# Patient Record
Sex: Female | Born: 1975
Health system: Southern US, Community
[De-identification: ages and names within clinical notes are randomized; demographics above are authoritative.]

## PROBLEM LIST (undated history)

## (undated) VITALS — BP 119/83 | HR 71 | Temp 97.3°F | Resp 20 | Ht 66.0 in | Wt 303.0 lb

## (undated) DIAGNOSIS — F419 Anxiety disorder, unspecified: Secondary | ICD-10-CM

## (undated) DIAGNOSIS — J45909 Unspecified asthma, uncomplicated: Secondary | ICD-10-CM

## (undated) DIAGNOSIS — F329 Major depressive disorder, single episode, unspecified: Secondary | ICD-10-CM

## (undated) DIAGNOSIS — F32A Depression, unspecified: Secondary | ICD-10-CM

## (undated) DIAGNOSIS — R7303 Prediabetes: Secondary | ICD-10-CM

## (undated) DIAGNOSIS — E119 Type 2 diabetes mellitus without complications: Secondary | ICD-10-CM

## (undated) DIAGNOSIS — E669 Obesity, unspecified: Secondary | ICD-10-CM

## (undated) DIAGNOSIS — F4310 Post-traumatic stress disorder, unspecified: Secondary | ICD-10-CM

## (undated) HISTORY — DX: Post-traumatic stress disorder, unspecified: F43.10

## (undated) HISTORY — DX: Anxiety disorder, unspecified: F41.9

## (undated) HISTORY — DX: Depression, unspecified: F32.A

## (undated) HISTORY — DX: Major depressive disorder, single episode, unspecified: F32.9

## (undated) HISTORY — DX: Unspecified asthma, uncomplicated: J45.909

## (undated) HISTORY — DX: Obesity, unspecified: E66.9

## (undated) HISTORY — DX: Prediabetes: R73.03

---

## 1998-07-04 ENCOUNTER — Emergency Department (HOSPITAL_COMMUNITY): Admission: EM | Admit: 1998-07-04 | Discharge: 1998-07-04 | Payer: Self-pay | Admitting: Emergency Medicine

## 1998-07-05 ENCOUNTER — Encounter: Payer: Self-pay | Admitting: Emergency Medicine

## 1998-07-05 ENCOUNTER — Ambulatory Visit (HOSPITAL_COMMUNITY): Admission: RE | Admit: 1998-07-05 | Discharge: 1998-07-05 | Payer: Self-pay | Admitting: Emergency Medicine

## 2002-01-16 ENCOUNTER — Encounter: Admission: RE | Admit: 2002-01-16 | Discharge: 2002-04-16 | Payer: Self-pay | Admitting: Internal Medicine

## 2002-03-23 HISTORY — PX: BACK SURGERY: SHX140

## 2002-03-23 HISTORY — PX: SPINAL FUSION: SHX223

## 2002-10-30 ENCOUNTER — Encounter: Payer: Self-pay | Admitting: Internal Medicine

## 2002-10-30 ENCOUNTER — Ambulatory Visit (HOSPITAL_COMMUNITY): Admission: RE | Admit: 2002-10-30 | Discharge: 2002-10-30 | Payer: Self-pay | Admitting: Internal Medicine

## 2003-01-08 ENCOUNTER — Emergency Department (HOSPITAL_COMMUNITY): Admission: EM | Admit: 2003-01-08 | Discharge: 2003-01-09 | Payer: Self-pay | Admitting: Emergency Medicine

## 2003-02-09 ENCOUNTER — Emergency Department (HOSPITAL_COMMUNITY): Admission: EM | Admit: 2003-02-09 | Discharge: 2003-02-09 | Payer: Self-pay | Admitting: Emergency Medicine

## 2003-03-24 ENCOUNTER — Emergency Department (HOSPITAL_COMMUNITY): Admission: EM | Admit: 2003-03-24 | Discharge: 2003-03-24 | Payer: Self-pay | Admitting: Emergency Medicine

## 2003-07-25 ENCOUNTER — Inpatient Hospital Stay (HOSPITAL_COMMUNITY): Admission: RE | Admit: 2003-07-25 | Discharge: 2003-07-28 | Payer: Self-pay | Admitting: Neurosurgery

## 2003-09-13 ENCOUNTER — Encounter: Admission: RE | Admit: 2003-09-13 | Discharge: 2003-09-13 | Payer: Self-pay | Admitting: Neurosurgery

## 2003-10-23 ENCOUNTER — Ambulatory Visit (HOSPITAL_COMMUNITY): Admission: RE | Admit: 2003-10-23 | Discharge: 2003-10-23 | Payer: Self-pay | Admitting: Internal Medicine

## 2003-11-02 ENCOUNTER — Encounter: Admission: RE | Admit: 2003-11-02 | Discharge: 2003-11-02 | Payer: Self-pay | Admitting: Neurosurgery

## 2004-01-31 ENCOUNTER — Emergency Department (HOSPITAL_COMMUNITY): Admission: EM | Admit: 2004-01-31 | Discharge: 2004-01-31 | Payer: Self-pay | Admitting: Emergency Medicine

## 2004-04-29 ENCOUNTER — Encounter: Admission: RE | Admit: 2004-04-29 | Discharge: 2004-04-29 | Payer: Self-pay | Admitting: Neurosurgery

## 2004-05-15 ENCOUNTER — Ambulatory Visit: Payer: Self-pay | Admitting: Psychiatry

## 2004-05-15 ENCOUNTER — Inpatient Hospital Stay (HOSPITAL_COMMUNITY): Admission: RE | Admit: 2004-05-15 | Discharge: 2004-05-19 | Payer: Self-pay | Admitting: Psychiatry

## 2004-12-15 ENCOUNTER — Other Ambulatory Visit (HOSPITAL_COMMUNITY): Admission: RE | Admit: 2004-12-15 | Discharge: 2005-03-15 | Payer: Self-pay | Admitting: Psychiatry

## 2004-12-15 ENCOUNTER — Ambulatory Visit: Payer: Self-pay | Admitting: Psychiatry

## 2006-03-02 ENCOUNTER — Emergency Department (HOSPITAL_COMMUNITY): Admission: EM | Admit: 2006-03-02 | Discharge: 2006-03-02 | Payer: Self-pay | Admitting: Emergency Medicine

## 2006-10-07 ENCOUNTER — Ambulatory Visit: Payer: Self-pay | Admitting: Family Medicine

## 2006-10-07 ENCOUNTER — Ambulatory Visit: Payer: Self-pay | Admitting: *Deleted

## 2006-10-14 ENCOUNTER — Ambulatory Visit: Payer: Self-pay | Admitting: Internal Medicine

## 2007-02-09 ENCOUNTER — Emergency Department (HOSPITAL_COMMUNITY): Admission: EM | Admit: 2007-02-09 | Discharge: 2007-02-09 | Payer: Self-pay | Admitting: Family Medicine

## 2007-02-10 ENCOUNTER — Emergency Department (HOSPITAL_COMMUNITY): Admission: EM | Admit: 2007-02-10 | Discharge: 2007-02-10 | Payer: Self-pay | Admitting: Emergency Medicine

## 2008-03-06 ENCOUNTER — Emergency Department (HOSPITAL_COMMUNITY): Admission: EM | Admit: 2008-03-06 | Discharge: 2008-03-06 | Payer: Self-pay | Admitting: Emergency Medicine

## 2008-03-23 HISTORY — PX: OTHER SURGICAL HISTORY: SHX169

## 2008-06-13 ENCOUNTER — Ambulatory Visit (HOSPITAL_COMMUNITY): Admission: RE | Admit: 2008-06-13 | Discharge: 2008-06-13 | Payer: Self-pay | Admitting: Emergency Medicine

## 2008-09-28 ENCOUNTER — Encounter (INDEPENDENT_AMBULATORY_CARE_PROVIDER_SITE_OTHER): Payer: Self-pay | Admitting: Obstetrics & Gynecology

## 2008-09-28 ENCOUNTER — Ambulatory Visit (HOSPITAL_COMMUNITY): Admission: RE | Admit: 2008-09-28 | Discharge: 2008-09-28 | Payer: Self-pay | Admitting: Obstetrics & Gynecology

## 2008-12-30 ENCOUNTER — Emergency Department (HOSPITAL_COMMUNITY): Admission: EM | Admit: 2008-12-30 | Discharge: 2008-12-30 | Payer: Self-pay | Admitting: Emergency Medicine

## 2009-04-13 ENCOUNTER — Emergency Department (HOSPITAL_COMMUNITY): Admission: EM | Admit: 2009-04-13 | Discharge: 2009-04-13 | Payer: Self-pay | Admitting: Emergency Medicine

## 2009-10-20 ENCOUNTER — Emergency Department (HOSPITAL_COMMUNITY): Admission: EM | Admit: 2009-10-20 | Discharge: 2009-10-20 | Payer: Self-pay | Admitting: Family Medicine

## 2010-02-04 ENCOUNTER — Emergency Department (HOSPITAL_COMMUNITY): Admission: EM | Admit: 2010-02-04 | Discharge: 2010-02-04 | Payer: Self-pay | Admitting: Family Medicine

## 2010-02-19 ENCOUNTER — Emergency Department (HOSPITAL_COMMUNITY)
Admission: EM | Admit: 2010-02-19 | Discharge: 2010-02-19 | Payer: Self-pay | Source: Home / Self Care | Admitting: Family Medicine

## 2010-04-13 ENCOUNTER — Encounter: Payer: Self-pay | Admitting: Emergency Medicine

## 2010-05-05 ENCOUNTER — Other Ambulatory Visit: Payer: Self-pay | Admitting: Internal Medicine

## 2010-05-05 DIAGNOSIS — Z1231 Encounter for screening mammogram for malignant neoplasm of breast: Secondary | ICD-10-CM

## 2010-05-26 ENCOUNTER — Ambulatory Visit
Admission: RE | Admit: 2010-05-26 | Discharge: 2010-05-26 | Disposition: A | Discharge status: Home / Self Care | Payer: 59 | Source: Ambulatory Visit | Attending: Internal Medicine | Admitting: Internal Medicine

## 2010-05-26 ENCOUNTER — Ambulatory Visit: Payer: Self-pay

## 2010-05-26 DIAGNOSIS — Z1231 Encounter for screening mammogram for malignant neoplasm of breast: Secondary | ICD-10-CM

## 2010-06-03 LAB — POCT RAPID STREP A (OFFICE): Streptococcus, Group A Screen (Direct): POSITIVE — AB

## 2010-06-26 LAB — POCT RAPID STREP A (OFFICE): Streptococcus, Group A Screen (Direct): NEGATIVE

## 2010-06-29 LAB — CBC
HCT: 37.3 % (ref 36.0–46.0)
Hemoglobin: 12.6 g/dL (ref 12.0–15.0)
MCV: 84.1 fL (ref 78.0–100.0)
RBC: 4.43 MIL/uL (ref 3.87–5.11)

## 2010-06-29 LAB — GLUCOSE, CAPILLARY: Glucose-Capillary: 142 mg/dL — ABNORMAL HIGH (ref 70–99)

## 2010-06-29 LAB — BASIC METABOLIC PANEL
Calcium: 8.9 mg/dL (ref 8.4–10.5)
Creatinine, Ser: 0.76 mg/dL (ref 0.4–1.2)
GFR calc non Af Amer: >60 mL/min (ref >60)
Potassium: 4 mEq/L (ref 3.5–5.1)

## 2010-07-07 ENCOUNTER — Encounter: Payer: 59 | Attending: Internal Medicine

## 2010-07-07 DIAGNOSIS — Z713 Dietary counseling and surveillance: Secondary | ICD-10-CM | POA: Insufficient documentation

## 2010-07-07 DIAGNOSIS — E119 Type 2 diabetes mellitus without complications: Secondary | ICD-10-CM | POA: Insufficient documentation

## 2010-08-05 ENCOUNTER — Encounter: Payer: 59 | Attending: Internal Medicine

## 2010-08-05 DIAGNOSIS — Z713 Dietary counseling and surveillance: Secondary | ICD-10-CM | POA: Insufficient documentation

## 2010-08-05 DIAGNOSIS — E119 Type 2 diabetes mellitus without complications: Secondary | ICD-10-CM | POA: Insufficient documentation

## 2010-08-05 NOTE — Op Note (Signed)
[name], [name]             ACCOUNT NO.:  [id number]   MEDICAL RECORD NO.:  [id number]          PATIENT TYPE:  AMB   LOCATION:  SDC                           FACILITY:  [facility]   PHYSICIAN:  Genia Del, M.D.DATE OF BIRTH:  [birth year]   DATE OF PROCEDURE:  09/28/2008  DATE OF DISCHARGE:                               OPERATIVE REPORT   PREOPERATIVE DIAGNOSIS:  Heavy menses need for routine Pap test.   POSTOPERATIVE DIAGNOSIS:  Heavy menses need for routine Pap test plus  hymenal band.   PROCEDURE:  Pap test, dilatation and curettage, NovaSure endometrial  ablation, and excision of hymenal band.   SURGEON:  Genia Del, MD   ASSISTANT:  No assistant.   ANESTHESIOLOGIST:  Raul Del, MD   PROCEDURE:  Under general anesthesia with endotracheal intubation, the  patient is in lithotomy position.  She is prepped with Betadine on the  suprapubic and vulvar areas.  The bladder is catheterized and the  patient is draped as usual.  We insert the speculum in the vagina.  At  that time, we note that the patient has a hymenal band.  We are able to  enter the [facility] speculum on the right opening of the hymen.  We  visualized the cervix.  A Pap test is done.  We then complete the  Betadine prep intravaginally.  We grasped the anterior lip of the cervix  with a tenaculum.  We do a paracervical block with Nesacaine 1% total of  20 mL at 4 and 8 o'clock.  We measured the cervical length which is at 5  cm.  The hysterometry is at 10 cm for a cavity length of 5 cm.  We then  dilate the cervix with Hegar dilators up to #29 without difficulty.  We  proceed with a systematic curettage of the intrauterine cavity with a  sharp curette.  The endometrial curettings are sent to pathology.  We  then insert the NovaSure instrument in the intrauterine cavity.  The  width of the uterus is measured at 4.5 cm.  We then did a test for the  integrity of the cavity and it is passed.  We  then proceed with the  endometrial ablation at a power of 124 for 1 minute 25 seconds.  The  instrument is then removed easily.  We removed the tenaculum from the  cervix.  Hemostasis is adequate at that level.  The speculum is removed.  We note mild bleeding at the superior aspect and at the base of the  hymenal band.  Decision is taken to excise the band.  We used the  electrocautery to resect at the base and then at the superior aspect  staying far from the urethra.  We then used Vicryl 2-0 to close the  mucosa at that level and complete hemostasis.  Two stitches are done  superiorly and 1 stitch at the base.  Hemostasis is adequate.  All  instruments are therefore removed.  The estimated blood loss was  minimal.  No complications occurred and the patient was brought to  recovery room in good stable  status.      Genia Del, M.D.  Electronically Signed     ML/MEDQ  D:  09/28/2008  T:  09/29/2008  Job:  [id number]
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
## 2010-08-08 NOTE — Op Note (Signed)
NAMEFRANKYE, [name]                        ACCOUNT NO.:  [id number]   MEDICAL RECORD NO.:  [id number]                   PATIENT TYPE:  INP   LOCATION:  2899                                 FACILITY:  [facility]   PHYSICIAN:  Donalee Citrin, M.D.                     DATE OF BIRTH:  Apr 21, 1975   DATE OF PROCEDURE:  07/25/2003  DATE OF DISCHARGE:                                 OPERATIVE REPORT   PREOPERATIVE DIAGNOSIS:  Discogenic mechanical low back pain and lumbar  spinal stenosis from large central ruptured disk at L4-5.   PROCEDURES:  1. Decompressive lumbar laminectomy, L4-5.  2. Posterior lumbar interbody fusion, L4-5.  3. Posterolateral arthrodesis using locally-harvested autograft, L4-5.  4. Posterior instrumentation using the M10 CD Horizon Legacy pedicle screw     system, L4-5.  5. Placement of a medium Hemovac drain.   SURGEON:  Donalee Citrin, M.D.   ASSISTANT:  Tia Alert, M.D.   ANESTHESIA:  General endotracheal.   HISTORY OF PRESENT ILLNESS:  The patient is a very pleasant 35 year old  female who has had longstanding back and bilateral leg pain consistent with  L5 radiculopathy.  The patient failed all modalities of conservative  treatment with physical therapy, steroid injections, and anti-  inflammatories.  The patient was taken to the operating room, where she  showed a very large central ruptured disk causing lumbar spinal stenosis and  severe degenerative collapse and annular tear.  The patient's symptomatology  was predominantly back much greater than leg pain, with bilateral  radiculopathy.  Due to the size of the fragment, its location, the  degeneration of the disk space, and the patient's symptomatology, the  patient was recommended a decompression and stabilization procedure.  I  extensively went over the risks and benefits of surgery with her.  She  understands and agreed to proceed forward.   The patient was brought into the OR, was induced under general  anesthesia,  placed prone on the Wilson frame.  The back was prepped and draped in the  usual sterile fashion.  After infiltration of 10 mL of lidocaine with  epinephrine, a midline incision made.  Bovie electrocautery was used to take  the incision through the subcutaneous tissue and subperiosteal dissection  carried out on the lamina of L3, L4, and L5, exposing the TPs of L4 and L5  bilaterally.  An intraoperative x-ray confirmed localization of the L4-5  disk space.  There was noted to be a synovial cyst projected posteriorly off  the lateral aspect of the facet complex on the right at L4-5, and this was  bitten down with a Leksell rongeur.  The medial aspect of the facet  complexes at the articulation was drilled down with a high-speed drill.  A  Leksell rongeur was used to remove the spinous process of L4 and then using  a 3 and 4 mm Kerrison punch,  complete radical medial factectomies and  laminectomies were completed at L4-5, exposing the proximal aspect of the L4  and L5 nerve roots bilaterally.  Using a 3 and 4 mm Kerrison punch, the L4  and L5 nerve roots were opened up in their foramen.  Then the large amount  of epidural veins were coagulated over the disk spaces on each side.  Then a  D'Errico nerve root retractor was used to reflect the right L5 nerve root  medially.  Annulotomy was made.  A pituitary rongeur was used to clean out  the disk space on the right side.  Several fragments of disk were removed  from the central compartment at this level, decompressing the central thecal  sac.  Then a 10 distractor was inserted.  Intraoperative fluoroscopy  confirmed good apposition of the end plates at this level.  Then on the left  side a D'Errico nerve root retractor was used to reflect the left L5 nerve  root medially.  The disk space was radically cleaned out with pituitary  rongeurs.  Several fragments of disk were removed, and a downgoing Epstein  curette was used to scrape  down several large disk fragments contained  within the ligament into the central disk space and cleaned out with  pituitary rongeurs.  Then using a size 10 cutter and chisel, the end plates  were prepared to receive the bone graft on the left side, fluoroscopy  confirming depth and trajectory at each step along the way.  Then a 10 x 26  mm Tangent allograft was inserted on the left side.  Then on the right side  the distractor was removed, the disk space was cleaned out, and the cutter  and chisel were used to prepare the end plates on this side.  Locally-  harvested autograft was packed against the left-sided allograft and the  right-sided allograft was inserted.  All allografts had good apposition with  the endplates, confirmed by fluoroscopy.  Then after both allografts had  been placed and placement confirmed, attention was taken to pedicle screw  placement.  Using a high-speed drill and fluoroscopy, pilot holes were  drilled at the L4 pedicle on the left, cannulated with the awl, probed,  tapped with a 5.5 tap, probed, and a 6.5 x 40 pedicle was inserted on the  left at L4.  This pedicle was probed from within the pedicle as well as  within the canal, confirming no medial or lateral breach.  Fluoroscopy  confirmed trajectory, and this screw had excellent purchase.  This procedure  was repeated at L5 on the left and at L4-5 on the right.  After all four  pedicle screws had been placed, aggressive irrigation was achieved, then  decortication along the lateral TPs and lateral gutters at that level, L4  and L5.  Then the remainder of the locally-harvested autograft was packed in  the lateral gutters over the decorticated TP and lateral facet complexes.  Then a 40 mm rod was sized, selected, and inserted, top-tightening nuts  tightened down at L5, the L4 pedicle screw was compressed against L5, and a  422 crosslink was inserted.  After all bone graft and rods and screws and crosslink were  inserted, fluoroscopy confirmed good position of the bone  grafts and screws and rods.  The neural foramina were reexplored with a  hockey stick and coronary dilator and noted to have widely patent as well as  underneath the thecal sac, and the central compartment noted to  be patent as  well.  Then Gelfoam was overlaid on top of the dura.  A medium Hemovac drain  was placed.  The muscle and fascia were reapproximated with 0 interrupted  Vicryl, the subcutaneous tissue was closed with 2-0 interrupted Vicryl, and  the skin was closed with a running 4-0 subcuticular suture.  Benzoin and  Steri-Strips were applied.  The patient went to the recovery room in stable  condition.  At the end of the procedure, all needle, instrument, and sponge  counts were correct.                                               Donalee Citrin, M.D.    GC/MEDQ  D:  07/25/2003  T:  07/25/2003  Job:  [id number]
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
## 2010-10-01 ENCOUNTER — Encounter: Payer: 59 | Attending: Internal Medicine | Admitting: *Deleted

## 2010-10-01 DIAGNOSIS — Z713 Dietary counseling and surveillance: Secondary | ICD-10-CM | POA: Insufficient documentation

## 2010-10-01 DIAGNOSIS — E119 Type 2 diabetes mellitus without complications: Secondary | ICD-10-CM | POA: Insufficient documentation

## 2010-10-02 ENCOUNTER — Encounter: Payer: Self-pay | Admitting: *Deleted

## 2010-10-02 NOTE — Patient Instructions (Addendum)
Goals:  Stay within carb ranges given at class to facilitate weight loss and regulate blood glucose.  Choose more whole grains, lean protein, low-fat dairy, and fruits/non-starchy vegetables.  Aim for 20 min of moderate physical activity 2-3 days a week.  Limit sugar-sweetened beverages and concentrated sweets.  Aim for 25-30g of fiber and 2000-2400mg  or less of sodium daily.

## 2010-10-02 NOTE — Progress Notes (Signed)
  Medical Nutrition Therapy:  Appt start time: 1700 end time:  1800.   Assessment:  Primary concerns today: Obesity; Pre-DM.  Pt previously attended DM classes at [facility] (06/2010) and here for individual counseling. Requests education on "what DM is doing in my body".  States she doesn't understand exactly WHY it's bad to have high BGs.  Not currently monitoring BG d/t frustration of values "swinging back and forth".  FBG ranged from 92-130 mg/dL; 2 hr PP: 409-811 mg/dL. Food recall reveals excessive intake of refine CHO (soda or sweet tea = 20-60oz/day, pop tarts, sugary cereal) as well as starches at meals. Pt also reports high sugar snacks before bed and breakfast skipping.     MEDICATIONS: none reported   DIETARY INTAKE Usual eating pattern includes 2 meals and 3 snacks per day.  24-hr recall:  B (AM): Poptart (frosted strawberry) OR SKIPS  Snk ( AM): Dannon light & fit yogurt (flavored), crystal light  L ( PM): Michelinas fettucine alfredo and banana, crystal light  Snk ( PM): Dannon yogurt D ( PM): Chicken breast in soy sauce, green beans, cucumber & tomatoes, mini corn on cobb Snk ( PM): 20 oz Coke + 1.5 cups Froot Loops w/ milk or 15-18 vanilla wafers  Usual physical activity: no structured exercise noted.  Estimated energy needs: 1400-1600 calories 165-170 g carbohydrates 110-115 g protein 40-42 g fat  Progress Towards Goal(s):  NEW   Nutritional Diagnosis:  [place]-3.3 Morbid obesity related to excessive refined sugar and fat intake as evidenced by patient-reported 24-hour recall and a BMI of 49.3 kg/m2.    Intervention Goals:  Stay within carb ranges given at class to facilitate weight loss and regulate blood glucose.  Choose more whole grains, lean protein, low-fat dairy, and fruits/non-starchy vegetables.  Aim for 20 min of moderate physical activity 2-3 days a week.  Limit sugar-sweetened beverages and concentrated sweets.  Aim for 25-30g of fiber and 2000-2400mg  or  less of sodium daily.  Monitoring/Evaluation:  Dietary intake, exercise, A1c, and body weight in 4 week(s).

## 2010-11-03 ENCOUNTER — Inpatient Hospital Stay (INDEPENDENT_AMBULATORY_CARE_PROVIDER_SITE_OTHER)
Admission: RE | Admit: 2010-11-03 | Discharge: 2010-11-03 | Disposition: A | Discharge status: Home / Self Care | Payer: 59 | Source: Ambulatory Visit | Attending: Family Medicine | Admitting: Family Medicine

## 2010-11-03 DIAGNOSIS — M779 Enthesopathy, unspecified: Secondary | ICD-10-CM

## 2010-11-22 ENCOUNTER — Emergency Department (HOSPITAL_COMMUNITY): Payer: 59

## 2010-11-22 ENCOUNTER — Inpatient Hospital Stay (HOSPITAL_COMMUNITY)
Admission: EM | Admit: 2010-11-22 | Discharge: 2010-11-25 | DRG: 149 | Disposition: A | Discharge status: Home / Self Care | Payer: 59 | Attending: Internal Medicine | Admitting: Internal Medicine

## 2010-11-22 DIAGNOSIS — R1115 Cyclical vomiting syndrome unrelated to migraine: Secondary | ICD-10-CM | POA: Diagnosis present

## 2010-11-22 DIAGNOSIS — H8110 Benign paroxysmal vertigo, unspecified ear: Principal | ICD-10-CM | POA: Diagnosis present

## 2010-11-22 DIAGNOSIS — E860 Dehydration: Secondary | ICD-10-CM | POA: Diagnosis present

## 2010-11-22 DIAGNOSIS — R7309 Other abnormal glucose: Secondary | ICD-10-CM | POA: Diagnosis present

## 2010-11-22 DIAGNOSIS — R51 Headache: Secondary | ICD-10-CM | POA: Diagnosis present

## 2010-11-22 LAB — CBC
MCH: 28.2 pg (ref 26.0–34.0)
MCHC: 33.6 g/dL (ref 30.0–36.0)
MCV: 84.1 fL (ref 78.0–100.0)
Platelets: 339 10*3/uL (ref 150–400)
RBC: 4.64 MIL/uL (ref 3.87–5.11)
RDW: 12.8 % (ref 11.5–15.5)

## 2010-11-22 LAB — GLUCOSE, CAPILLARY: Glucose-Capillary: 143 mg/dL — ABNORMAL HIGH (ref 70–99)

## 2010-11-22 LAB — DIFFERENTIAL
Basophils Relative: 1 % (ref 0–1)
Eosinophils Absolute: 0.2 10*3/uL (ref 0.0–0.7)
Lymphocytes Relative: 26 % (ref 12–46)
Lymphs Abs: 2.6 10*3/uL (ref 0.7–4.0)
Monocytes Absolute: 0.6 10*3/uL (ref 0.1–1.0)

## 2010-11-22 LAB — URINALYSIS, ROUTINE W REFLEX MICROSCOPIC
Bilirubin Urine: NEGATIVE
Glucose, UA: NEGATIVE mg/dL
Specific Gravity, Urine: 1.009 (ref 1.005–1.030)

## 2010-11-22 LAB — POCT I-STAT, CHEM 8
Calcium, Ion: 1.14 mmol/L (ref 1.12–1.32)
Chloride: 103 mEq/L (ref 96–112)
Glucose, Bld: 142 mg/dL — ABNORMAL HIGH (ref 70–99)
HCT: 40 % (ref 36.0–46.0)
Hemoglobin: 13.6 g/dL (ref 12.0–15.0)
Potassium: 4 mEq/L (ref 3.5–5.1)
TCO2: 23 mmol/L (ref 0–100)

## 2010-11-22 LAB — LIPASE, BLOOD: Lipase: 27 U/L (ref 11–59)

## 2010-11-22 MED ORDER — IOHEXOL 300 MG/ML  SOLN
125.0000 mL | Freq: Once | INTRAMUSCULAR | Status: AC | PRN
  Administered 2010-11-22: 125 mL via INTRAVENOUS

## 2010-11-23 LAB — URINE CULTURE: Culture: NO GROWTH

## 2010-11-23 LAB — COMPREHENSIVE METABOLIC PANEL
ALT: 19 U/L (ref 0–35)
AST: 14 U/L (ref 0–37)
Albumin: 3.1 g/dL — ABNORMAL LOW (ref 3.5–5.2)
Alkaline Phosphatase: 67 U/L (ref 39–117)
CO2: 25 mEq/L (ref 19–32)
Chloride: 100 mEq/L (ref 96–112)
GFR calc non Af Amer: >60 mL/min (ref >60)
Glucose, Bld: 182 mg/dL — ABNORMAL HIGH (ref 70–99)
Potassium: 4.1 mEq/L (ref 3.5–5.1)
Total Bilirubin: 0.5 mg/dL (ref 0.3–1.2)

## 2010-11-23 LAB — TSH: TSH: 0.532 u[IU]/mL (ref 0.350–4.500)

## 2010-11-23 LAB — HEMOGLOBIN A1C: Hgb A1c MFr Bld: 7 % — ABNORMAL HIGH (ref <5.7)

## 2010-11-24 ENCOUNTER — Inpatient Hospital Stay (HOSPITAL_COMMUNITY): Payer: 59

## 2010-11-24 ENCOUNTER — Other Ambulatory Visit (HOSPITAL_COMMUNITY): Payer: 59

## 2010-11-24 LAB — CBC
HCT: 37.4 % (ref 36.0–46.0)
Hemoglobin: 11.9 g/dL — ABNORMAL LOW (ref 12.0–15.0)
MCV: 87.4 fL (ref 78.0–100.0)
RDW: 13 % (ref 11.5–15.5)
WBC: 7.8 10*3/uL (ref 4.0–10.5)

## 2010-11-24 LAB — BASIC METABOLIC PANEL
BUN: 7 mg/dL (ref 6–23)
CO2: 25 mEq/L (ref 19–32)
Chloride: 106 mEq/L (ref 96–112)
Creatinine, Ser: 0.77 mg/dL (ref 0.50–1.10)
GFR calc Af Amer: >60 mL/min (ref >60)
Potassium: 3.7 mEq/L (ref 3.5–5.1)

## 2010-11-24 MED ORDER — IOHEXOL 300 MG/ML  SOLN
100.0000 mL | Freq: Once | INTRAMUSCULAR | Status: AC | PRN
  Administered 2010-11-24: 100 mL via INTRAVENOUS

## 2010-11-24 NOTE — H&P (Signed)
NAMESHERILYN, WINDHORST             ACCOUNT NO.:  [id number]  MEDICAL RECORD NO.:  [id number]  LOCATION:  WLED                         FACILITY:  [facility]  PHYSICIAN:  Marinda Elk, M.D.DATE OF BIRTH:  28-Dec-1975  DATE OF ADMISSION:  11/22/2010 DATE OF DISCHARGE:                             HISTORY & PHYSICAL   PRIMARY CARE DOCTOR:  Lovenia Kim, D.O.  CHIEF COMPLAINT:  Flank pain, nausea, vomiting, abdominal pain.  HISTORY:  This is a 35 year old with no significant past medical history that comes in for nausea and vomiting and dizziness upon standing this morning.  She relates that when she woke up this morning she felt like lightheaded and not able to ambulate, she felt like she is going to fall.  She relates some flank pain, sitting up makes it worse and laying flat makes it better.  This pain has no radiation.  It is intermittent. She relates no vaginal discharge.  No diarrhea.  No fever, no chest pain.  No shortness of breath.  The pain has been there on her flank. It is reproducible by palpation.  So, the ED got a CT scan of the abdomen and pelvis that showed mild gaseous extension, but otherwise negative so we were asked to admit and further evaluate.  ALLERGIES:  To BEE STINGS.  PAST MEDICAL HISTORY: 1. She had a L4-L5 spinal stenosis. 2. Uterine ablation. 3. History of syncope.  MEDICATIONS:  Albuterol.  SOCIAL HISTORY:  She denies alcohol, tobacco, or drugs.  FAMILY HISTORY:  The patient does not have any family to contact.  She does not know.  REVIEW OF SYSTEMS:  10-point review of system done, pertinent positive per HPI.  PHYSICAL EXAMINATION:  VITAL SIGNS:  Temperature 98, pulse 64, respirations 18, satting 100%, blood pressure 117/76. GENERAL:  She is awake, alert and oriented x3, coherent, fluent language. HEENT: Dry mucous membrane. NECK: No JVD.  No bruits.  No thyromegaly.  Anicteric.  No pallor. CARDIOVASCULAR:  She has regular rate and  rhythm with positive S1 and S2. LUNGS:  Good air movement, clear to auscultation. ABDOMEN: Mild epigastric tenderness.  No CVA tenderness.  Pain on the left flank, reproducible by palpation.  No suprapubic pain, but abdomen is soft. EXTREMITIES:  Positive pulses.  No clubbing, cyanosis, or edema. NEURO EXAM:  Nonfocal.  LABORATORIES ON ADMISSION:  Shows a white count of 10.1, hemoglobin of 13.1, platelet count of 339, ANC of 6.6.  Urine pregnancy test is negative.  Her UA does not show any signs of infection.  Her sodium is 138, potassium 4.0, chloride 103, blood glucose of 142, BUN of 9, creatinine 1.9, bicarb of 23.  CT scan of the abdomen and pelvis showed mild gastric distention likely related to recent water administration for CT scan.  No other acute abnormalities.  ASSESSMENT/PLAN: 1. Intractable nausea and vomiting with flank pain.  At this time, the     patient cannot be sent home as she has ongoing vomiting.  So, we     will check a lipase.  We will check also a UDS. We will try to     control her nausea and vomiting with Zofran and Phenergan.  We  will     also use narcotics to control pain, we use Reglan p.r.n. to try to     decrease the nausea and vomiting.  We will check blood cultures x2.     We will check a chest x-ray which was not done by the ED physician     to rule out any pneumonia.  If she is able to tolerate POs, she     could probably be discharged in the morning.  At this time, we will     check a lipase to rule out pancreatitis.  She does not have any     vaginal discharge.  She does not have a white count.  We will also     check a CMET  to rule out any kind of hepatic disease or     gallbladder disease as she still has a gallbladder. 2. Orthostatic hypotension.  We will give IV fluids aggressively.  We     will check a BMET in the morning and check orthostatic in the     morning.     Marinda Elk, M.D.     AF/MEDQ  D:  11/22/2010  T:   11/22/2010  Job:  [id number]  Electronically Signed by Marinda Elk M.D. on 11/24/2010 06:57:10 AM

## 2010-11-29 LAB — CULTURE, BLOOD (ROUTINE X 2): Culture  Setup Time: 201209021058

## 2010-12-02 NOTE — Discharge Summary (Signed)
[name], [name]             ACCOUNT NO.:  [id number]  MEDICAL RECORD NO.:  [id number]  LOCATION:  1519                         FACILITY:  [facility]  PHYSICIAN:  Erick Blinks, MD     DATE OF BIRTH:  July 12, 1975  DATE OF ADMISSION:  11/22/2010 DATE OF DISCHARGE:                              DISCHARGE SUMMARY   PRIMARY CARE PHYSICIAN:  Lovenia Kim, D.O.  DISCHARGE DIAGNOSES: 1. Benign positional vertigo, resolved. 2. Prediabetes. 3. Morbid obesity. 4. Intractable vomiting, resolved. 5. Dehydration, improved. 6. History of neurocardiogenic syncope.  DISCHARGE MEDICATIONS: 1. Meclizine 25 mg p.o. t.i.d. p.r.n. 2. Scopolamine 1.5 mg per 72-hour patch, one patch transdermal q.72     hours p.r.n. 3. Albuterol inhaler 2 puffs inhaled q.6 hours p.r.n.  ADMISSION HISTORY:  This is a 35 year old female who comes in to the hospital with intractable nausea, vomiting, and dizziness.  The patient's symptoms started on the morning of admission.  She felt lightheaded, was unable to ambulate and felt like she was going to fall. She was given some oral medication in the emergency room, but due to persistence of her symptoms, she was felt not safe to discharge home and she was subsequently admitted to the hospital for further evaluation and treatment.  HOSPITAL COURSE: 1. Vertigo.  The patient had a CT of her head which did not show any     acute findings.  We were unable to do an MRI due to the patient's     body habitus.  She was placed on meclizine as well as scopolamine     patch, which promptly resolved her symptoms.  She was also seen by     Physical Therapy for vestibular rehab, and it was noted that she     did have a moderate Dix-Hallpike on the right.  She was educated on     different exercises and stretches to help deal with this.  She     declined any further outpatient vestibular therapy.  The patient     had a TSH done which was also found to be at normal limits.   Cortisol was also normal.  She is ambulating without difficulty, is     requesting to discharge home which appears to be appropriate. 2. Pre-diabetes.  The patient had A1c checked which was found to be 7.     She reports that her primary care physician will be starting her on     metformin.  She was also educated on lifestyle modifications.  DIAGNOSTIC IMAGING: 1. CT abdomen and pelvis done on admission for vomiting, shows mild     gastric retention likely related to recent water administration.     CT scan correlate clinically. 2. Chest x-ray on admission shows no evidence of active pulmonary     disease. 3. CT head on September 3 shows no acute intracranial findings or mass     lesions.  DISCHARGE INSTRUCTIONS:  The patient should continue on heart-healthy diet, conduct her activities tolerated.  She will follow up with her primary care physician as scheduled.  Plan was discussed with the patient who was also in agreement.  CONDITION AT TIME OF DISCHARGE:  Improved.  Erick Blinks, MD     JM/MEDQ  D:  11/25/2010  T:  11/25/2010  Job:  [id number]  cc:   Lovenia Kim, D.O. Fax: [phone]  Electronically Signed by Erick Blinks  on 12/02/2010 10:06:11 PM

## 2010-12-26 LAB — URINE CULTURE: Colony Count: >=100000

## 2010-12-26 LAB — URINE MICROSCOPIC-ADD ON

## 2010-12-26 LAB — PREGNANCY, URINE: Preg Test, Ur: NEGATIVE

## 2010-12-26 LAB — URINALYSIS, ROUTINE W REFLEX MICROSCOPIC
Bilirubin Urine: NEGATIVE
Glucose, UA: NEGATIVE mg/dL
Specific Gravity, Urine: 1.017 (ref 1.005–1.030)
pH: 7 (ref 5.0–8.0)

## 2010-12-30 LAB — POCT RAPID STREP A: Streptococcus, Group A Screen (Direct): NEGATIVE

## 2011-04-24 ENCOUNTER — Other Ambulatory Visit: Payer: Self-pay | Admitting: Emergency Medicine

## 2011-04-24 DIAGNOSIS — Z1231 Encounter for screening mammogram for malignant neoplasm of breast: Secondary | ICD-10-CM

## 2011-06-01 ENCOUNTER — Ambulatory Visit
Admission: RE | Admit: 2011-06-01 | Discharge: 2011-06-01 | Disposition: A | Discharge status: Home / Self Care | Payer: 59 | Source: Ambulatory Visit | Attending: Emergency Medicine | Admitting: Emergency Medicine

## 2011-06-01 DIAGNOSIS — Z1231 Encounter for screening mammogram for malignant neoplasm of breast: Secondary | ICD-10-CM

## 2011-06-03 ENCOUNTER — Other Ambulatory Visit: Payer: Self-pay | Admitting: Emergency Medicine

## 2011-06-03 DIAGNOSIS — Z1231 Encounter for screening mammogram for malignant neoplasm of breast: Secondary | ICD-10-CM

## 2011-09-02 ENCOUNTER — Emergency Department (HOSPITAL_COMMUNITY)
Admission: EM | Admit: 2011-09-02 | Discharge: 2011-09-02 | Disposition: A | Discharge status: Home / Self Care | Payer: 59 | Source: Home / Self Care

## 2011-09-02 ENCOUNTER — Encounter (HOSPITAL_COMMUNITY): Payer: Self-pay | Admitting: *Deleted

## 2011-09-02 DIAGNOSIS — R197 Diarrhea, unspecified: Secondary | ICD-10-CM

## 2011-09-02 DIAGNOSIS — R55 Syncope and collapse: Secondary | ICD-10-CM

## 2011-09-02 NOTE — ED Provider Notes (Signed)
[name] is a 36 y.o. female who presents to Urgent Care today for abdominal cramping associated with diarrhea and potential syncope. 1 AM this morning the patient awoke with abdominal cramping and mild pain.  She went to the bathroom and had nonbloody diarrhea. Will set of the toilet she felt funny. Her partner witnessed a syncopal event that lasted about 30 seconds. She never fell to the floor and quickly regained consciousness. Since this episode she is felt well. She has not had any further syncopal events. She denies any chest pains or palpitations or trouble breathing around the event or sense. She hasn't had any more diarrhea.she denies any nausea vomiting or current abdominal pain dysuria or vaginal discharge.  She has a history of neurocardiogenic syncope with no events recently. She says a syncopal that was not similar.  No seizure activity noted.     PMH reviewed. Significant for diabetes in the past history of neurocardiogenic syncope with no events the last 10 years or so.  History  Substance Use Topics  . Smoking status: Never Smoker   . Smokeless tobacco: Never Used  . Alcohol Use: No   ROS as above Medications reviewed. No current facility-administered medications for this encounter.   No current outpatient prescriptions on file.    Exam:  BP 124/94  Pulse 80  Temp 98.7 F (37.1 C) (Oral)  Resp 20  SpO2 100% Gen: Well NAD, obese  HEENT: EOMI,  MMM, normal neck motion  Lungs: CTABL Nl WOB Heart: RRR no MRG Abd: NABS, NT, ND, soft  Exts: Non edematous BL  LE, warm and well perfused.  Neuro: Alert and oriented x3 normal motion and coordination and gait  No results found for this or any previous visit (from the past 24 hour(s)). No results found.  Assessment and Plan: 36 y.o. female with syncope. Likely syncope was vasovagal. Patient was having a bowel movement and in pain.  She has history of neurocardiogenic syncope but this is not consistent with that.     I'm not sure the cause of her diarrhea. Intermittent nonbloody diarrhea could be food poisoning or many other causes.   Her syncopal that does not appear to be dangerous. She feels well currently and her vital signs are within normal limits as is her exam.  I feel that we can safely discharge the patient tonight with followup with primary care doctor. She may need an event monitor or Holter monitor if this recurs.  Discussed warning signs or symptoms. Please see discharge instructions. Patient expresses understanding.      Rodolph Bong, MD 09/02/11 [phone]

## 2011-09-02 NOTE — ED Notes (Signed)
pT  REPORTS  SYMPTOMS  OF ABD  CRAMPING  ASSOCIATED  WITH  SOME  DIARRHEA  THIS  AM  HAD  A  SYNCOPAL   EPISODE  THIS  AM             BRIEFLY      -  AT  THIS  TIME  SHE  IS  AWAKE  AS  WELL  AS  ALERT AND  ORIENTED      NO  VOMITING

## 2011-09-02 NOTE — Discharge Instructions (Signed)
Thank you for coming in today. Please follow up with your Dr. in about one week.  Call or go to the emergency room if you get worse, have trouble breathing, have chest pains, or palpitations.  I think you he passed out because your hurting with your cramping and diarrhea. I think you will do well.

## 2011-09-03 NOTE — ED Provider Notes (Signed)
Medical screening examination/treatment/procedure(s) were performed by PGY-3 FM resident and as supervising physician I was immediately available for consultation/collaboration.   Rocio Roam Moreno-Coll, MD   Britnee Mcdevitt Moreno-Coll, MD 09/03/11 0935 

## 2011-09-03 NOTE — ED Notes (Signed)
Triage  Ac[facility]  Is  3

## 2012-01-12 ENCOUNTER — Other Ambulatory Visit: Payer: Self-pay | Admitting: Physician Assistant

## 2012-06-19 ENCOUNTER — Encounter (HOSPITAL_COMMUNITY): Payer: Self-pay | Admitting: *Deleted

## 2012-06-19 ENCOUNTER — Inpatient Hospital Stay (HOSPITAL_COMMUNITY)
Admission: AD | Admit: 2012-06-19 | Discharge: 2012-06-21 | DRG: 430 | Disposition: A | Discharge status: Home / Self Care | Payer: BC Managed Care – PPO | Attending: Psychiatry | Admitting: Psychiatry

## 2012-06-19 DIAGNOSIS — F332 Major depressive disorder, recurrent severe without psychotic features: Principal | ICD-10-CM | POA: Diagnosis present

## 2012-06-19 DIAGNOSIS — E669 Obesity, unspecified: Secondary | ICD-10-CM | POA: Diagnosis present

## 2012-06-19 DIAGNOSIS — F331 Major depressive disorder, recurrent, moderate: Secondary | ICD-10-CM | POA: Diagnosis present

## 2012-06-19 DIAGNOSIS — R7309 Other abnormal glucose: Secondary | ICD-10-CM | POA: Diagnosis present

## 2012-06-19 DIAGNOSIS — R45851 Suicidal ideations: Secondary | ICD-10-CM

## 2012-06-19 DIAGNOSIS — Z79899 Other long term (current) drug therapy: Secondary | ICD-10-CM

## 2012-06-19 MED ORDER — ACETAMINOPHEN 325 MG PO TABS: 650.0000 mg | ORAL_TABLET | Freq: Four times a day (QID) | ORAL | Status: DC | PRN

## 2012-06-19 MED ORDER — ALUM & MAG HYDROXIDE-SIMETH 200-200-20 MG/5ML PO SUSP: 30.0000 mL | ORAL | Status: DC | PRN

## 2012-06-19 MED ORDER — MAGNESIUM HYDROXIDE 400 MG/5ML PO SUSP: 30.0000 mL | Freq: Every day | ORAL | Status: DC | PRN
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
## 2012-06-20 ENCOUNTER — Encounter (HOSPITAL_COMMUNITY): Payer: Self-pay | Admitting: Psychiatry

## 2012-06-20 DIAGNOSIS — F332 Major depressive disorder, recurrent severe without psychotic features: Principal | ICD-10-CM

## 2012-06-20 DIAGNOSIS — F331 Major depressive disorder, recurrent, moderate: Secondary | ICD-10-CM | POA: Diagnosis present

## 2012-06-20 LAB — URINALYSIS, ROUTINE W REFLEX MICROSCOPIC
Glucose, UA: 100 mg/dL — AB
Hgb urine dipstick: NEGATIVE
Specific Gravity, Urine: 1.032 — ABNORMAL HIGH (ref 1.005–1.030)
Urobilinogen, UA: 2 mg/dL — ABNORMAL HIGH (ref 0.0–1.0)

## 2012-06-20 LAB — COMPREHENSIVE METABOLIC PANEL
AST: 14 U/L (ref 0–37)
Albumin: 3.4 g/dL — ABNORMAL LOW (ref 3.5–5.2)
Alkaline Phosphatase: 73 U/L (ref 39–117)
Chloride: 99 mEq/L (ref 96–112)
Potassium: 4.3 mEq/L (ref 3.5–5.1)
Sodium: 138 mEq/L (ref 135–145)
Total Bilirubin: 0.3 mg/dL (ref 0.3–1.2)

## 2012-06-20 LAB — CBC
Platelets: 377 10*3/uL (ref 150–400)
RDW: 12.9 % (ref 11.5–15.5)
WBC: 11.1 10*3/uL — ABNORMAL HIGH (ref 4.0–10.5)

## 2012-06-20 LAB — ETHANOL: Alcohol, Ethyl (B): <11 mg/dL (ref 0–11)

## 2012-06-20 LAB — GLUCOSE, CAPILLARY
Glucose-Capillary: 154 mg/dL — ABNORMAL HIGH (ref 70–99)
Glucose-Capillary: 178 mg/dL — ABNORMAL HIGH (ref 70–99)

## 2012-06-20 LAB — RAPID URINE DRUG SCREEN, HOSP PERFORMED
Cocaine: NOT DETECTED
Opiates: NOT DETECTED
Tetrahydrocannabinol: NOT DETECTED

## 2012-06-20 LAB — HEPATIC FUNCTION PANEL
Bilirubin, Direct: 0.1 mg/dL (ref 0.0–0.3)
Indirect Bilirubin: 0.6 mg/dL (ref 0.3–0.9)

## 2012-06-20 LAB — HEMOGLOBIN A1C: Mean Plasma Glucose: 174 mg/dL — ABNORMAL HIGH (ref <117)

## 2012-06-20 MED ORDER — BUPROPION HCL ER (XL) 150 MG PO TB24
150.0000 mg | ORAL_TABLET | Freq: Every day | ORAL | Status: DC
  Administered 2012-06-21: 150 mg via ORAL
  Filled 2012-06-20 (×3): qty 1

## 2012-06-20 MED ORDER — METFORMIN HCL ER 500 MG PO TB24
500.0000 mg | ORAL_TABLET | Freq: Every day | ORAL | Status: DC
  Filled 2012-06-20 (×3): qty 1

## 2012-06-20 MED ORDER — METFORMIN HCL ER 500 MG PO TB24: 1000.0000 mg | ORAL_TABLET | Freq: Every day | ORAL | Status: DC

## 2012-06-20 MED ORDER — CITALOPRAM HYDROBROMIDE 40 MG PO TABS
40.0000 mg | ORAL_TABLET | Freq: Every day | ORAL | Status: DC
  Administered 2012-06-20: 40 mg via ORAL
  Filled 2012-06-20 (×4): qty 1

## 2012-06-20 MED ORDER — ARIPIPRAZOLE 2 MG PO TABS
2.0000 mg | ORAL_TABLET | Freq: Every day | ORAL | Status: DC
  Administered 2012-06-20: 2 mg via ORAL
  Filled 2012-06-20 (×5): qty 1

## 2012-06-20 MED ORDER — INSULIN ASPART 100 UNIT/ML ~~LOC~~ SOLN: 0.0000 [IU] | Freq: Three times a day (TID) | SUBCUTANEOUS | Status: DC

## 2012-06-20 MED ORDER — VITAMIN D (ERGOCALCIFEROL) 1.25 MG (50000 UNIT) PO CAPS
50000.0000 [IU] | ORAL_CAPSULE | ORAL | Status: DC
  Filled 2012-06-20: qty 1
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
## 2012-06-20 NOTE — Progress Notes (Signed)
[facility] LCSW Group Therapy        Overcoming Obstacles 1:15 2:30 PM         06/20/2012 3:29 PM  Type of Therapy:  Group Therapy  Participation Level:  Minimal  Participation Quality:  Appropriate  Affect:  Appropriate and Depressed  Cognitive:  Appropriate  Insight:  Engaged  Engagement in Therapy:  Engaged  Modes of Intervention:  Discussion, Exploration, Problem-solving, Rapport Building and Support  Summary of Progress/Problems:  Patient shared the obstacle she needs to overcome is financial problems.  She shared she and partner have cut expenses and that she has been considering a part time job.  Wynn Banker 06/20/2012, 3:29 PM

## 2012-06-20 NOTE — Progress Notes (Signed)
D: Patient in the dayroom reading on approach.  Patient states she does not want to take Metformin or Insulin because she does not take it at home.  Patient states she does not even check her blood sugar at home.  Patient states she only gets her blood sugar taken at her doctors office.  Patient states she is ready to go home.  Patient states she has learned that it is ok to ask for help.  Patient denies SI/HI and denies AVH.   A: Staff to monitor Q 15 mins for safety.  Encouragement and support offered.  Scheduled medications administered per orders. R: Patient remains safe on the unit.  Patient visible on the unit and interacting with peers.  Patient calm. Cooperative and taking administered medications.

## 2012-06-20 NOTE — Progress Notes (Signed)
Adult Psychoeducational Group Note  Date:  06/20/2012 Time:  7:11 PM  Group Topic/Focus:  Self Care:   The focus of this group is to help patients understand the importance of self-care in order to improve or restore emotional, physical, spiritual, interpersonal, and financial health.  Participation Level:  Minimal  Participation Quality:  Appropriate  Affect:  Appropriate and Flat  Cognitive:  Appropriate  Insight: Appropriate  Engagement in Group:  Limited  Modes of Intervention:  Education  Additional Comments:  Pt actively participated in group by completing the self-care inventory. Pt was called out of group for PA consult. Pt remained out of group for the remainder of the session.  Reinaldo Raddle K 06/20/2012, 7:11 PM

## 2012-06-20 NOTE — Progress Notes (Signed)
Chaplain provided emotional and spiritual support with pt during recreation time.    Pt shared financial struggles, history with conservative family and religious background, stresses with daughter.  Chaplain provided support and empathic presence.    Will continue to follow during admission.

## 2012-06-20 NOTE — Tx Team (Signed)
Interdisciplinary Treatment Plan Update   Date Reviewed:  06/20/2012  Time Reviewed:  10:57 AM  Progress in Treatment:   Attending groups: Yes Participating in groups: Yes Taking medication as prescribed: Yes  Tolerating medication: Yes Family/Significant other contact made: No, but will ask for consent to contact family Patient understands diagnosis: Yes  Discussing patient identified problems/goals with staff: Yes Medical problems stabilized or resolved: Yes Denies suicidal/homicidal ideation: Yes Patient has not harmed self or others: Yes  For review of initial/current patient goals, please see plan of care.  Estimated Length of Stay:  2-3 days  Reasons for Continued Hospitalization:  Anxiety Depression Medication stabilization  New Problems/Goals identified:    Discharge Plan or Barriers:   Home with outpatient follow up  Additional Comments:  Patient advised of admitting to hospital due to SI but no longer endorsing SI/HI.  She rates depression at five and anxiety at zero.  Attendees:  Patient:  06/20/2012 10:57 AM   Signature:  06/20/2012 10:57 AM  Signature 06/20/2012 10:57 AM  Signature: Liborio Nixon, RN 06/20/2012 10:57 AM  Signature:Beverly Terrilee Croak, RN 06/20/2012 10:57 AM  Signature:  Neill Loft RN 06/20/2012 10:57 AM  Signature:  Juline Patch, LCSW 06/20/2012 10:57 AM  Signature: Silverio Decamp, PMH-NP 06/20/2012 10:57 AM  Signature: Liliane Bade, BSW 06/20/2012 10:57 AM  Signature:  06/20/2012 10:57 AM  Signature:    Signature:    Signature:      Scribe for Treatment Team:   Juline Patch,  06/20/2012 10:57 AM

## 2012-06-20 NOTE — Progress Notes (Addendum)
D:  Patient's self inventory sheet, patient has fair sleep, improving appetite, normal energy level, improving attention span.  Rated depression #4, hopelessness #1.  Denied withdrawals.  Denied SI.  Denied physical problems.  After discharge, plans to see psychiatrist and take her medications.  "I want to go home as soon as possible."  No problems taking meds after discharge. A:  Medications administered per MD orders.  Support and encouragement given per MD orders. R:  Following treatment plan.  Denied SI and HI.   Denied A/V hallucinations.  Contracts for safety.  1700  Patient's CBG was 154.  Patient refused novolog 4 units that was to be given per MD orders.  Patient stated she was going to refuse all diabetic medications, that she did not need these medications.  MD informed. Patient stated she wants welbutrin Tuesday morning. UA put in lab refrigerator for pick up Monday night.
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
## 2012-06-21 DIAGNOSIS — F331 Major depressive disorder, recurrent, moderate: Secondary | ICD-10-CM

## 2012-06-21 LAB — GLUCOSE, CAPILLARY: Glucose-Capillary: 130 mg/dL — ABNORMAL HIGH (ref 70–99)

## 2012-06-21 LAB — LIPID PANEL: Cholesterol: 164 mg/dL (ref 0–200)

## 2012-06-21 MED ORDER — BUPROPION HCL ER (XL) 150 MG PO TB24: 150.0000 mg | ORAL_TABLET | Freq: Every day | ORAL | Status: DC

## 2012-06-21 MED ORDER — CITALOPRAM HYDROBROMIDE 40 MG PO TABS: 40.0000 mg | ORAL_TABLET | Freq: Every day | ORAL | Status: DC

## 2012-06-21 MED ORDER — ARIPIPRAZOLE 2 MG PO TABS: 2.0000 mg | ORAL_TABLET | Freq: Every day | ORAL | Status: DC

## 2012-06-21 MED ORDER — METFORMIN HCL ER 500 MG PO TB24: 500.0000 mg | ORAL_TABLET | Freq: Every day | ORAL | Status: DC

## 2012-06-21 NOTE — Progress Notes (Addendum)
[facility] Adult Case Management Discharge Plan :  Will you be returning to the same living situation after discharge: Yes,  Patient is returning to her home. At discharge, do you have transportation home?:Yes,  Patient's wife to transport her home. Do you have the ability to pay for your medications:Yes,  Patient is able to afford medications.  Release of information consent forms completed and in the chart;  Patient's signature needed at discharge.  Patient to Follow up at: Follow-up Information   Follow up with Dr. Jannifer Franklin On 07/07/2012. (Thursday, July 07, 2012 at 1:00 PM.  Please arrvive 15 minutes early for appointment.)    Contact information:   [address] [place], [place]   [address]  [phone]      Follow up with Jorje Guild Alcott.   Contact information:      [phone]      Follow up with Jorje Guild Aloctt On 06/23/2012. (Thursday, June 23, 2012 at 5:00 PM)    Contact information:   [address] [place], [place]   [address]  [phone]      Patient denies SI/HI:   Yes,  Patient was not endorsing SI when seen by writer on 06/20/12 and per MD note, she is not endorsing SI today.  Patient was not endorsing HI or thoughts of self harm when seen by writer on 06/20/12.  Safety Planning and Suicide Prevention discussed:  Yes,  Reviewed during aftercare group.  Wynn Banker 06/21/2012, 5:49 PM

## 2012-06-21 NOTE — [facility] Suicide Risk Assessment (Signed)
Suicide Risk Assessment  Discharge Assessment     Demographic Factors:  Caucasian and Gay, lesbian, or bisexual orientation  Mental Status Per Nursing Assessment::   On Admission:  NA  Current Mental Status by Physician: In full contact with reality. There are no suicidal ideas, plans or intent. States that she feels better. Mood is euthymic, affect is appropriate. Endorses that she had to come when she did so, but that now she has regained a sense of control so she does not need this level of care. She is going to ask her therapist to slow the trauma work and allow her to recover before resuming the work   Loss Factors: NA  Historical Factors: NA  Risk Reduction Factors:   Responsible for children under 74 years of age, Sense of responsibility to family, Living with another person, especially a relative, Positive social support and Positive therapeutic relationship  Continued Clinical Symptoms:  Depression:   Insomnia  Cognitive Features That Contribute To Risk: None identified   Suicide Risk:  Minimal: No identifiable suicidal ideation.  Patients presenting with no risk factors but with morbid ruminations; may be classified as minimal risk based on the severity of the depressive symptoms  Discharge Diagnoses:   AXIS I:  Major Depression recurrent AXIS II:  Deferred AXIS III:   Past Medical History  Diagnosis Date  . Obesity   . Pre-diabetes    AXIS IV:  other psychosocial or environmental problems AXIS V:  61-70 mild symptoms  Plan Of Care/Follow-up recommendations:  Activity:  as tolerated Diet:  regular Pursue outpatient follow up Is patient on multiple antipsychotic therapies at discharge:  No   Has Patient had three or more failed trials of antipsychotic monotherapy by history:  No  Recommended Plan for Multiple Antipsychotic Therapies: N/A   [name] A 06/21/2012, 1:46 PM

## 2012-06-21 NOTE — Progress Notes (Signed)
[facility] INPATIENT:  Family/Significant Other Suicide Prevention Education  Suicide Prevention Education:  Education Completed; [name], Wife, [phone], has been identified by the patient as the family member/significant other with whom the patient will be residing, and identified as the person(s) who will aid the patient in the event of a mental health crisis (suicidal ideations/suicide attempt).  With written consent from the patient, the family member/significant other has been provided the following suicide prevention education, prior to the and/or following the discharge of the patient.  The suicide prevention education provided includes the following:  Suicide risk factors  Suicide prevention and interventions  National Suicide Hotline telephone number  [facility] assessment telephone number  [facility] Emergency Assistance 911  [facility] and/or Residential Mobile Crisis Unit telephone number  Request made of family/significant other to:  Remove weapons (e.g., guns, rifles, knives), all items previously/currently identified as safety concern.  Wife advised there are no guns in the home.  Remove drugs/medications (over-the-counter, prescriptions, illicit drugs), all items previously/currently identified as a safety concern.  Patient wife is a former Veterinary surgeon at [employer] and stated she is aware of Suicide Prevention Education as that was a part of her position at [employer] and she has the phone number for mobile crisis management.    The family member/significant other verbalizes understanding of the suicide prevention education information provided.  The family member/significant other agrees to remove the items of safety concern listed above.  Patient's wife is   [name] 06/21/2012, 9:15 AM

## 2012-06-21 NOTE — Discharge Summary (Signed)
Physician Discharge Summary Note  Patient:  [name] is an 37 y.o., female MRN:  [id number] DOB:  [birth year] Patient phone:  [phone] (home)  Patient address:   [address] [place] [address],   Date of Admission:  06/19/2012 Date of Discharge: 06/21/12  Reason for Admission:  Depression with SI  Discharge Diagnoses: Principal Problem:   Major depressive disorder, recurrent episode, moderate  Review of Systems  Constitutional: Negative.   HENT: Negative.   Eyes: Negative.   Respiratory: Negative.   Cardiovascular: Negative.   Gastrointestinal: Negative.   Genitourinary: Negative.   Musculoskeletal: Negative.   Skin: Negative.   Neurological: Negative.   Endo/Heme/Allergies: Negative.   Psychiatric/Behavioral: Positive for depression. Negative for suicidal ideas, hallucinations, memory loss and substance abuse. The patient has insomnia. The patient is not nervous/anxious.    Axis Diagnosis:   AXIS I:  Major Depression, Recurrent severe AXIS II:  Deferred AXIS III:   Past Medical History  Diagnosis Date  . Obesity   . Pre-diabetes    AXIS IV:  other psychosocial or environmental problems and problems with primary support group AXIS V:  61-70 mild symptoms  Level of Care:  OP  Hospital Course:  This was a voluntary admission for this 37 year old MWF who presented to [facility] as a walk in with her therapist, reporting increasing depression, suicidal ideation with a plan to jump off the down town parking deck, poor sleep, carbohydrate craving, crying spells, inability to focus at work, poor concentration, increased frustration over small things, mood swings, and decreased self care.  The duration of stay was two days.The patient was seen and evaluated by the Treatment team consisting of Psychiatrist, NP-C, RN, Case Manager, and Therapist for evaluation and treatment plan with goal of stabilization upon discharge. The patient's physical and mental health problems  were identified and treated appropriately.      Multiple modalities of treatment were used including medication, individual and group therapies, unit programming, improved nutrition, physical activity, and family sessions as needed. The MD managed the medication during the admission starting Abilify 2 mg daily, Wellbutrin XL 150 mg daily, continued Celexa 40 mg. Metformin 24hr 500 mg was ordered to address the patient's diabetes. However, the patient mainly refused the medication reporting that she did not take anything at home. Her A1C was diagnostic of diabetes at 7.7. [name] was not receptive to education regarding this diagnosis.      The symptoms of Depression were monitored daily by evaluation by clinical provider.  The patient's mental and emotional status was evaluated by a daily self inventory completed by the patient.      Improvement was demonstrated by declining numbers on the self assessment, improving vital signs, increased cognition, and improvement in mood, sleep, appetite as well as a reduction in physical symptoms.       The patient was evaluated and found to be stable enough for discharge and was released to home per the initial plan of treatment.   Mental Status Exam:  For mental status exam please see mental status exam and  suicide risk assessment completed by attending physician prior to discharge.    Consults:  None  Significant Diagnostic Studies:  labs: Chem profile, Lipid profile, CBC  Discharge Vitals:   Blood pressure 119/83, pulse 71, temperature 97.3 F (36.3 C), temperature source Oral, resp. rate 20, height 5\' 6"  (1.676 m), weight 137.44 kg (303 lb). Body mass index is 48.93 kg/(m^2). Lab Results:   Results for  orders placed during the hospital encounter of 06/19/12 (from the past 72 hour(s))  HEPATIC FUNCTION PANEL     Status: Abnormal   Collection Time    06/20/12  6:25 AM      Result Value Range   Total Protein 7.1  6.0 - 8.3 g/dL   Albumin 3.3 (*) 3.5 -  5.2 g/dL   AST 14  0 - 37 U/L   ALT 15  0 - 35 U/L   Alkaline Phosphatase 76  39 - 117 U/L   Total Bilirubin 0.7  0.3 - 1.2 mg/dL   Bilirubin, Direct 0.1  0.0 - 0.3 mg/dL   Indirect Bilirubin 0.6  0.3 - 0.9 mg/dL  HEMOGLOBIN Z6X     Status: Abnormal   Collection Time    06/20/12  6:25 AM      Result Value Range   Hemoglobin A1C 7.7 (*) <5.7 %   Comment: (NOTE)                                                                               According to the ADA Clinical Practice Recommendations for 2011, when     HbA1c is used as a screening test:      >=6.5%   Diagnostic of Diabetes Mellitus               (if abnormal result is confirmed)     5.7-6.4%   Increased risk of developing Diabetes Mellitus     References:Diagnosis and Classification of Diabetes Mellitus,Diabetes     Care,2011,34(Suppl 1):S62-S69 and Standards of Medical Care in             Diabetes - 2011,Diabetes Care,2011,34 (Suppl 1):S11-S61.   Mean Plasma Glucose 174 (*) <117 mg/dL  URINALYSIS, ROUTINE W REFLEX MICROSCOPIC     Status: Abnormal   Collection Time    06/20/12  3:45 PM      Result Value Range   Color, Urine [name] (*) YELLOW   Comment: BIOCHEMICALS MAY BE AFFECTED BY COLOR   APPearance CLOUDY (*) CLEAR   Specific Gravity, Urine 1.032 (*) 1.005 - 1.030   pH 6.0  5.0 - 8.0   Glucose, UA 100 (*) NEGATIVE mg/dL   Hgb urine dipstick NEGATIVE  NEGATIVE   Bilirubin Urine SMALL (*) NEGATIVE   Ketones, ur NEGATIVE  NEGATIVE mg/dL   Protein, ur NEGATIVE  NEGATIVE mg/dL   Urobilinogen, UA 2.0 (*) 0.0 - 1.0 mg/dL   Nitrite NEGATIVE  NEGATIVE   Leukocytes, UA NEGATIVE  NEGATIVE   Comment: MICROSCOPIC NOT DONE ON URINES WITH NEGATIVE PROTEIN, BLOOD, LEUKOCYTES, NITRITE, OR GLUCOSE <1000 mg/dL.  URINE RAPID DRUG SCREEN (HOSP PERFORMED)     Status: None   Collection Time    06/20/12  3:45 PM      Result Value Range   Opiates NONE DETECTED  NONE DETECTED   Cocaine NONE DETECTED  NONE DETECTED   Benzodiazepines  NONE DETECTED  NONE DETECTED   Amphetamines NONE DETECTED  NONE DETECTED   Tetrahydrocannabinol NONE DETECTED  NONE DETECTED   Barbiturates NONE DETECTED  NONE DETECTED   Comment:            DRUG SCREEN  FOR MEDICAL PURPOSES     ONLY.  IF CONFIRMATION IS NEEDED     FOR ANY PURPOSE, NOTIFY LAB     WITHIN 5 DAYS.                LOWEST DETECTABLE LIMITS     FOR URINE DRUG SCREEN     Drug Class       Cutoff (ng/mL)     Amphetamine      1000     Barbiturate      200     Benzodiazepine   200     Tricyclics       300     Opiates          300     Cocaine          300     THC              50  PREGNANCY, URINE     Status: None   Collection Time    06/20/12  3:45 PM      Result Value Range   Preg Test, Ur NEGATIVE  NEGATIVE   Comment:            THE SENSITIVITY OF THIS     METHODOLOGY IS >20 mIU/mL.  GLUCOSE, CAPILLARY     Status: Abnormal   Collection Time    06/20/12  5:11 PM      Result Value Range   Glucose-Capillary 154 (*) 70 - 99 mg/dL  CBC     Status: Abnormal   Collection Time    06/20/12  7:40 PM      Result Value Range   WBC 11.1 (*) 4.0 - 10.5 K/uL   RBC 4.56  3.87 - 5.11 MIL/uL   Hemoglobin 12.9  12.0 - 15.0 g/dL   HCT 64.4  03.4 - 74.2 %   MCV 86.4  78.0 - 100.0 fL   MCH 28.3  26.0 - 34.0 pg   MCHC 32.7  30.0 - 36.0 g/dL   RDW 59.5  63.8 - 75.6 %   Platelets 377  150 - 400 K/uL  COMPREHENSIVE METABOLIC PANEL     Status: Abnormal   Collection Time    06/20/12  7:40 PM      Result Value Range   Sodium 138  135 - 145 mEq/L   Potassium 4.3  3.5 - 5.1 mEq/L   Chloride 99  96 - 112 mEq/L   CO2 28  19 - 32 mEq/L   Glucose, Bld 175 (*) 70 - 99 mg/dL   BUN 16  6 - 23 mg/dL   Creatinine, Ser 4.33  0.50 - 1.10 mg/dL   Calcium 9.7  8.4 - 29.5 mg/dL   Total Protein 7.4  6.0 - 8.3 g/dL   Albumin 3.4 (*) 3.5 - 5.2 g/dL   AST 14  0 - 37 U/L   ALT 15  0 - 35 U/L   Alkaline Phosphatase 73  39 - 117 U/L   Total Bilirubin 0.3  0.3 - 1.2 mg/dL   GFR calc non Af Amer >90   >90 mL/min   GFR calc Af Amer >90  >90 mL/min   Comment:            The eGFR has been calculated     using the CKD EPI equation.     This calculation has not been     validated in all clinical     situations.  eGFR's persistently     <90 mL/min signify     possible Chronic Kidney Disease.  MAGNESIUM     Status: None   Collection Time    06/20/12  7:40 PM      Result Value Range   Magnesium 2.0  1.5 - 2.5 mg/dL  ETHANOL     Status: None   Collection Time    06/20/12  7:40 PM      Result Value Range   Alcohol, Ethyl (B) <11  0 - 11 mg/dL   Comment:            LOWEST DETECTABLE LIMIT FOR     SERUM ALCOHOL IS 11 mg/dL     FOR MEDICAL PURPOSES ONLY  TSH     Status: None   Collection Time    06/20/12  7:40 PM      Result Value Range   TSH 1.351  0.350 - 4.500 uIU/mL  GLUCOSE, CAPILLARY     Status: Abnormal   Collection Time    06/20/12  9:24 PM      Result Value Range   Glucose-Capillary 178 (*) 70 - 99 mg/dL   Comment 1 Notify RN    GLUCOSE, CAPILLARY     Status: Abnormal   Collection Time    06/21/12  6:07 AM      Result Value Range   Glucose-Capillary 156 (*) 70 - 99 mg/dL  LIPID PANEL     Status: Abnormal   Collection Time    06/21/12  6:42 AM      Result Value Range   Cholesterol 164  0 - 200 mg/dL   Triglycerides 84  <161 mg/dL   HDL 39 (*) >09 mg/dL   Total CHOL/HDL Ratio 4.2     VLDL 17  0 - 40 mg/dL   LDL Cholesterol 604 (*) 0 - 99 mg/dL   Comment:            Total Cholesterol/HDL:CHD Risk     Coronary Heart Disease Risk Table                         Men   Women      1/2 Average Risk   3.4   3.3      Average Risk       5.0   4.4      2 X Average Risk   9.6   7.1      3 X Average Risk  23.4   11.0                Use the calculated Patient Ratio     above and the CHD Risk Table     to determine the patient's CHD Risk.                ATP III CLASSIFICATION (LDL):      <100     mg/dL   Optimal      540-981  mg/dL   Near or Above                         Optimal      130-159  mg/dL   Borderline      191-478  mg/dL   High      >295     mg/dL   Very High  GLUCOSE, CAPILLARY     Status: Abnormal   Collection Time  06/21/12 12:03 PM      Result Value Range   Glucose-Capillary 130 (*) 70 - 99 mg/dL    Physical Findings: AIMS: Facial and Oral Movements Muscles of Facial Expression: None, normal Lips and Perioral Area: None, normal Jaw: None, normal Tongue: None, normal,Extremity Movements Upper (arms, wrists, hands, fingers): None, normal Lower (legs, knees, ankles, toes): None, normal, Trunk Movements Neck, shoulders, hips: None, normal, Overall Severity Severity of abnormal movements (highest score from questions above): None, normal Incapacitation due to abnormal movements: None, normal Patient's awareness of abnormal movements (rate only patient's report): No Awareness, Dental Status Current problems with teeth and/or dentures?: No Does patient usually wear dentures?: No  CIWA:  CIWA-Ar Total: 0 COWS:  COWS Total Score: 0  Psychiatric Specialty Exam: See Psychiatric Specialty Exam and Suicide Risk Assessment completed by Attending Physician prior to discharge.  Discharge destination:  Home  Is patient on multiple antipsychotic therapies at discharge:  No   Has Patient had three or more failed trials of antipsychotic monotherapy by history:  No  Recommended Plan for Multiple Antipsychotic Therapies: N/A  Discharge Orders   Future Orders Complete By Expires     Activity as tolerated - No restrictions  As directed     Diet - low sodium heart healthy  As directed         Medication List    TAKE these medications     Indication   ARIPiprazole 2 MG tablet  Commonly known as:  ABILIFY  Take 1 tablet (2 mg total) by mouth daily.   Indication:  Major Depressive Disorder     buPROPion 150 MG 24 hr tablet  Commonly known as:  WELLBUTRIN XL  Take 1 tablet (150 mg total) by mouth daily.   Indication:  Major Depressive  Disorder     busPIRone 7.5 MG tablet  Commonly known as:  BUSPAR  Take 7.5 mg by mouth 2 (two) times daily.      citalopram 40 MG tablet  Commonly known as:  CELEXA  Take 1 tablet (40 mg total) by mouth at bedtime.   Indication:  Depression     metFORMIN 500 MG 24 hr tablet  Commonly known as:  GLUCOPHAGE-XR  Take 1 tablet (500 mg total) by mouth daily with breakfast.   Indication:  Type 2 Diabetes     Vitamin D (Ergocalciferol) 50000 UNITS Caps  Commonly known as:  DRISDOL  Take 50,000 Units by mouth every Wednesday.            Follow-up Information   Follow up with Dr. Jannifer Franklin On 07/07/2012. (Thursday, July 07, 2012 at 1:00 PM.  Please arrvive 15 minutes early for appointment.)    Contact information:   [address] [place], [place]   [address]  [phone]      Follow up with Jorje Guild Alcott.   Contact information:      [phone]      Follow up with Jorje Guild Aloctt On 06/23/2012. (Thursday, June 23, 2012 at 5:00 PM)    Contact information:   [address] [place], [place]   [address]  [phone]      Follow-up recommendations:  Activity:  As tolerated Diet:  Heart healthy Tests:  None Other:  Follow up as recommended Will continue the Abilify as an augmenting agent on a short term basis. Need to monitor blood glucose in order to detect any changes produced by the Abilify.  Change the scope of therapy from trauma work to  more CBT working on coping skills Comments:  Patient was not compliant with Diabetic therapy while in the hospital. She was educated about the benefit of continuing the therapy.   Total Discharge Time:  Greater than 30 minutes.  SignedFransisca Kaufmann ANN NP-C 06/21/2012, 4:36 PM

## 2012-06-21 NOTE — Progress Notes (Signed)
Discharge Note:  Patient discharged.  Patient denied SI and HI.  Denied A/V hallucinations.  Denied pain.  Patient was adamant about being discharged first even though other patients' rides were in lobby.   Patient would not take her medications as ordered by MD at scheduled times.  Patient refused all diabetic medications during her admission to [facility].  Patient stated she did learn that she had a support system while at [facility].  Patient received all her belongings, clothing, prescriptions.

## 2012-06-21 NOTE — Progress Notes (Addendum)
D:  Patient's self inventory sheet, patient has fair sleep, improving appetite, normal energy level, improving attention span.  Rated depression #2.  Denied hopelessness.  Denied withdrawals.  Denied SI.  Denied physical problems.  After discharge, plans to accept help, continue medications and therapy.  Would like to go home.  Does have discharge plans.  No problems taking meds after discharge.  A:  Medications administered per MD order.  Support and encouragement given throughout day. R:  Denied SI and HI.  Contracts for safety.  Denied A/V hallucinations. Patient refuses all diabetic medications.   Requests her abilify at night, not in the morning.    At discharge, patient was adamant that she needed to be discharged first because her ride was coming to pick her up.  Patient would not take many of her medications as ordered by MD.  Refused all her diabetic medications.  Patient stated that she did learn that she had a support system while at [facility].  Patient was discharged to waiting area in [facility] to wait for her ride at patient's request.

## 2012-06-24 NOTE — Progress Notes (Signed)
Patient Discharge Instructions:  After Visit Summary (AVS):   Faxed to:  06/24/12 Discharge Summary Note:   Faxed to:  06/24/12 Psychiatric Admission Assessment Note:   Faxed to:  06/24/12 Suicide Risk Assessment - Discharge Assessment:   Faxed to:  06/24/12 Faxed/Sent to the Next Level Care provider:  06/24/12 Faxed to Dr. Jannifer Franklin @ [phone] Records sent via mail to: [name] [address] [place], [place] [address]  [name], 06/24/2012, 2:30 PM

## 2012-08-29 ENCOUNTER — Emergency Department (HOSPITAL_COMMUNITY)
Admission: EM | Admit: 2012-08-29 | Discharge: 2012-08-29 | Disposition: A | Discharge status: Home / Self Care | Payer: BC Managed Care – PPO | Source: Home / Self Care | Attending: Family Medicine | Admitting: Family Medicine

## 2012-08-29 ENCOUNTER — Encounter (HOSPITAL_COMMUNITY): Payer: Self-pay | Admitting: *Deleted

## 2012-08-29 DIAGNOSIS — R05 Cough: Secondary | ICD-10-CM

## 2012-08-29 DIAGNOSIS — R058 Other specified cough: Secondary | ICD-10-CM

## 2012-08-29 DIAGNOSIS — R059 Cough, unspecified: Secondary | ICD-10-CM

## 2012-08-29 MED ORDER — BENZONATATE 100 MG PO CAPS: 100.0000 mg | ORAL_CAPSULE | Freq: Three times a day (TID) | ORAL | Status: DC

## 2012-08-29 MED ORDER — ALBUTEROL SULFATE HFA 108 (90 BASE) MCG/ACT IN AERS: 1.0000 | INHALATION_SPRAY | Freq: Four times a day (QID) | RESPIRATORY_TRACT | Status: DC | PRN

## 2012-08-29 MED ORDER — DEXAMETHASONE SODIUM PHOSPHATE 10 MG/ML IJ SOLN
INTRAMUSCULAR | Status: AC
  Filled 2012-08-29: qty 1

## 2012-08-29 MED ORDER — DEXAMETHASONE SODIUM PHOSPHATE 10 MG/ML IJ SOLN
10.0000 mg | Freq: Once | INTRAMUSCULAR | Status: AC
  Administered 2012-08-29: 10 mg via INTRAMUSCULAR

## 2012-08-29 MED ORDER — FEXOFENADINE HCL 180 MG PO TABS: 180.0000 mg | ORAL_TABLET | Freq: Every day | ORAL | Status: DC

## 2012-08-29 NOTE — ED Notes (Signed)
Pt    Reports     She  Was  Stung  Yesterday  By  A     Bee         And  Developed  A  Dry  Non  Productive  Cough       She  Reports  Had  A  Similar  Episode       sev  Weeks    Ago She       denys   Any  Rash       No  Angioedema        Speaking in  Complete  sentances  Between  Coughing  episodes

## 2012-08-29 NOTE — ED Provider Notes (Signed)
History     CSN: [id number]  Arrival date & time 08/29/12  1025   First MD Initiated Contact with Patient 08/29/12 1121      Chief Complaint  Patient presents with  . Cough    (Consider location/radiation/quality/duration/timing/severity/associated sxs/prior treatment) HPI Comments: 37 year old female with history of exertional asthma as a child. Here complaining of chest tightness and persistent cough spells after she was had a bee sting yesterday. Patient states she has had mild allergic reactions in the past associated with cough and wheezing with bee stings had similar symptoms one week ago after being stung by a bee that resolved after 3 days. Denies rash. No nausea vomiting. No abdominal pain. No dizziness. No difficulty taking a deep breath or swallowing. No fever or chills. No chest pain.   Past Medical History  Diagnosis Date  . Obesity   . Pre-diabetes     Past Surgical History  Procedure Laterality Date  . Spinal fusion  2004  . Back surgery  2004  . Uterine ablation  2010    Family History  Problem Relation Age of Onset  . Heart Problems Father     History  Substance Use Topics  . Smoking status: Never Smoker   . Smokeless tobacco: Never Used  . Alcohol Use: No    OB History   Grav Para Term Preterm Abortions TAB SAB Ect Mult Living                  Review of Systems  Constitutional: Negative for fever, chills, diaphoresis, activity change, appetite change and fatigue.  HENT: Positive for congestion. Negative for sore throat.   Respiratory: Positive for cough and chest tightness. Negative for shortness of breath and stridor.   Cardiovascular: Negative for chest pain, palpitations and leg swelling.  Gastrointestinal: Negative for nausea, vomiting, abdominal pain and diarrhea.  Skin: Negative for rash.  Neurological: Negative for dizziness and headaches.    Allergies  Bee venom  Home Medications   Current Outpatient Rx  Name  Route  Sig   Dispense  Refill  . albuterol (PROVENTIL HFA;VENTOLIN HFA) 108 (90 BASE) MCG/ACT inhaler   Inhalation   Inhale 1-2 puffs into the lungs every 6 (six) hours as needed for wheezing.   1 Inhaler   0   . ARIPiprazole (ABILIFY) 2 MG tablet   Oral   Take 1 tablet (2 mg total) by mouth daily.   30 tablet   0   . benzonatate (TESSALON) 100 MG capsule   Oral   Take 1 capsule (100 mg total) by mouth every 8 (eight) hours.   21 capsule   0   . buPROPion (WELLBUTRIN XL) 150 MG 24 hr tablet   Oral   Take 1 tablet (150 mg total) by mouth daily.   30 tablet   0   . busPIRone (BUSPAR) 7.5 MG tablet   Oral   Take 7.5 mg by mouth 2 (two) times daily.         . citalopram (CELEXA) 40 MG tablet   Oral   Take 1 tablet (40 mg total) by mouth at bedtime.   30 tablet   0   . fexofenadine (ALLEGRA) 180 MG tablet   Oral   Take 1 tablet (180 mg total) by mouth daily.   30 tablet   0   . metFORMIN (GLUCOPHAGE-XR) 500 MG 24 hr tablet   Oral   Take 1 tablet (500 mg total) by mouth daily with breakfast.  30 tablet   0   . Vitamin D, Ergocalciferol, (DRISDOL) 50000 UNITS CAPS   Oral   Take 50,000 Units by mouth every Wednesday.           BP 140/76  Pulse 81  Temp(Src) 98.3 F (36.8 C) (Oral)  Resp 16  SpO2 100%  Physical Exam  Nursing note and vitals reviewed. Constitutional: She is oriented to person, place, and time. She appears well-developed and well-nourished. No distress.  HENT:  Head: Normocephalic and atraumatic.  Mouth/Throat: No oropharyngeal exudate.  Nasal Congestion with erythema and swelling of nasal turbinates, clear rhinorrhea. Tonsillar erythema no exudates. No uvula deviation. No trismus. No angioedema. TM's normal.  Eyes: Conjunctivae are normal. No scleral icterus.  Neck: Neck supple. No JVD present. No thyromegaly present.  Cardiovascular: Normal rate, regular rhythm and normal heart sounds.   Pulmonary/Chest: Effort normal and breath sounds normal.  No respiratory distress. She has no wheezes. She has no rales. She exhibits no tenderness.  No stridor  Abdominal: Soft. There is no tenderness.  Lymphadenopathy:    She has no cervical adenopathy.  Neurological: She is alert and oriented to person, place, and time.  Skin: No rash noted. She is not diaphoretic.    ED Course  Procedures (including critical care time)  Labs Reviewed - No data to display No results found.   1. Allergic cough       MDM  Treated here with Decadron 10 mg IM x1. Prescribed albuterol, fexofenadine and Tessalon Perles. Supportive care and red flags that should prompt her return to medical attention discussed with patient and provided in writing.        Sharin Grave, MD 08/29/12 1209

## 2013-05-12 ENCOUNTER — Emergency Department (HOSPITAL_COMMUNITY): Payer: BC Managed Care – PPO

## 2013-05-12 ENCOUNTER — Encounter (HOSPITAL_COMMUNITY): Payer: Self-pay | Admitting: Emergency Medicine

## 2013-05-12 ENCOUNTER — Emergency Department (HOSPITAL_COMMUNITY)
Admission: EM | Admit: 2013-05-12 | Discharge: 2013-05-12 | Disposition: A | Discharge status: Home / Self Care | Payer: BC Managed Care – PPO | Attending: Emergency Medicine | Admitting: Emergency Medicine

## 2013-05-12 DIAGNOSIS — I880 Nonspecific mesenteric lymphadenitis: Secondary | ICD-10-CM | POA: Insufficient documentation

## 2013-05-12 DIAGNOSIS — E669 Obesity, unspecified: Secondary | ICD-10-CM | POA: Insufficient documentation

## 2013-05-12 DIAGNOSIS — R110 Nausea: Secondary | ICD-10-CM | POA: Insufficient documentation

## 2013-05-12 LAB — CBC WITH DIFFERENTIAL/PLATELET
Basophils Absolute: 0.2 10*3/uL — ABNORMAL HIGH (ref 0.0–0.1)
Basophils Relative: 1 % (ref 0–1)
Eosinophils Absolute: 0.3 10*3/uL (ref 0.0–0.7)
Eosinophils Relative: 2 % (ref 0–5)
HCT: 42.7 % (ref 36.0–46.0)
Hemoglobin: 14.8 g/dL (ref 12.0–15.0)
Lymphocytes Relative: 32 % (ref 12–46)
Lymphs Abs: 3.5 10*3/uL (ref 0.7–4.0)
MCH: 29.7 pg (ref 26.0–34.0)
MCHC: 34.7 g/dL (ref 30.0–36.0)
MCV: 85.7 fL (ref 78.0–100.0)
Monocytes Absolute: 0.6 10*3/uL (ref 0.1–1.0)
Monocytes Relative: 5 % (ref 3–12)
Neutro Abs: 6.6 10*3/uL (ref 1.7–7.7)
Neutrophils Relative %: 59 % (ref 43–77)
Platelets: 340 10*3/uL (ref 150–400)
RBC: 4.98 MIL/uL (ref 3.87–5.11)
RDW: 12.7 % (ref 11.5–15.5)
WBC: 11.1 10*3/uL — ABNORMAL HIGH (ref 4.0–10.5)

## 2013-05-12 LAB — COMPREHENSIVE METABOLIC PANEL
ALT: 42 U/L — ABNORMAL HIGH (ref 0–35)
AST: 22 U/L (ref 0–37)
Albumin: 3.9 g/dL (ref 3.5–5.2)
Alkaline Phosphatase: 94 U/L (ref 39–117)
BUN: 9 mg/dL (ref 6–23)
CO2: 24 mEq/L (ref 19–32)
Calcium: 9.2 mg/dL (ref 8.4–10.5)
Chloride: 98 mEq/L (ref 96–112)
Creatinine, Ser: 0.72 mg/dL (ref 0.50–1.10)
GFR calc Af Amer: >90 mL/min (ref >90)
GFR calc non Af Amer: >90 mL/min (ref >90)
Glucose, Bld: 365 mg/dL — ABNORMAL HIGH (ref 70–99)
Potassium: 4.1 mEq/L (ref 3.7–5.3)
Sodium: 137 mEq/L (ref 137–147)
Total Bilirubin: 0.5 mg/dL (ref 0.3–1.2)
Total Protein: 7.9 g/dL (ref 6.0–8.3)

## 2013-05-12 LAB — URINALYSIS, ROUTINE W REFLEX MICROSCOPIC
Bilirubin Urine: NEGATIVE
Glucose, UA: >1000 mg/dL — AB
Hgb urine dipstick: NEGATIVE
Ketones, ur: NEGATIVE mg/dL
Leukocytes, UA: NEGATIVE
Nitrite: NEGATIVE
Protein, ur: NEGATIVE mg/dL
Specific Gravity, Urine: 1.02 (ref 1.005–1.030)
Urobilinogen, UA: 0.2 mg/dL (ref 0.0–1.0)
pH: 5.5 (ref 5.0–8.0)

## 2013-05-12 LAB — URINE MICROSCOPIC-ADD ON

## 2013-05-12 LAB — LIPASE, BLOOD: LIPASE: 46 U/L (ref 11–59)

## 2013-05-12 MED ORDER — OXYCODONE-ACETAMINOPHEN 5-325 MG PO TABS
1.0000 | ORAL_TABLET | Freq: Four times a day (QID) | ORAL | Status: DC | PRN
Start: 2013-05-12 — End: 2016-03-03

## 2013-05-12 MED ORDER — IOHEXOL 300 MG/ML  SOLN
25.0000 mL | Freq: Once | INTRAMUSCULAR | Status: AC | PRN
  Administered 2013-05-12: 25 mL via ORAL

## 2013-05-12 MED ORDER — MORPHINE SULFATE 4 MG/ML IJ SOLN
4.0000 mg | Freq: Once | INTRAMUSCULAR | Status: DC
  Filled 2013-05-12: qty 1

## 2013-05-12 MED ORDER — SODIUM CHLORIDE 0.9 % IV BOLUS (SEPSIS)
1000.0000 mL | Freq: Once | INTRAVENOUS | Status: AC
  Administered 2013-05-12: 1000 mL via INTRAVENOUS

## 2013-05-12 MED ORDER — ONDANSETRON HCL 4 MG/2ML IJ SOLN
4.0000 mg | Freq: Once | INTRAMUSCULAR | Status: AC
  Administered 2013-05-12: 4 mg via INTRAVENOUS
  Filled 2013-05-12: qty 2

## 2013-05-12 MED ORDER — IOHEXOL 300 MG/ML  SOLN
100.0000 mL | Freq: Once | INTRAMUSCULAR | Status: AC | PRN
  Administered 2013-05-12: 100 mL via INTRAVENOUS

## 2013-05-12 NOTE — Discharge Instructions (Signed)
Mesenteric Adenitis °Mesenteric adenitis is an inflammation of lymph nodes (glands) in the abdomen. It may appear to mimic appendicitis symptoms. It is most common in children. The cause of this may be an infection somewhere else in the body. It usually gets well without treatment but can cause problems for up to a couple weeks. °SYMPTOMS  °The most common problems are: °· Fever. °· Abdominal pain and tenderness. °· Nausea, vomiting, and/or diarrhea. °DIAGNOSIS  °Your caregiver may have an idea what is wrong by examining you or your child. Sometimes lab work and other studies such as Ultrasonography and a CT scan of the abdomen are done.  °TREATMENT  °Children with mesenteric adenitis will get well without further treatment. Treatment includes rest, pain medications, and fluids. °HOME CARE INSTRUCTIONS  °· Do not take or give laxatives unless ordered by your caregiver. °· Use pain medications as directed. °· Follow the diet recommended by your caregiver. °SEEK IMMEDIATE MEDICAL CARE IF:  °· The pain does not go away or becomes severe. °· An oral temperature above 102° F (38.9° C) develops. °· Repeated vomiting occurs. °· The pain becomes localized in the right lower quadrant of the abdomen (possibly appendicitis). °· You or your child notice bright red or black tarry stools. °MAKE SURE YOU:  °· Understand these instructions. °· Will watch your condition. °· Will get help right away if you are not doing well or get worse. °Document Released: 12/11/2005 Document Revised: 06/01/2011 Document Reviewed: 12/24/2005 °[employer]® Patient Information ©2014 [employer]. ° °

## 2013-05-12 NOTE — ED Provider Notes (Signed)
CSN: [id number]     Arrival date & time 05/12/13  1826 History   First MD Initiated Contact with Patient 05/12/13 2012     Chief Complaint  Patient presents with  . Abdominal Pain     (Consider location/radiation/quality/duration/timing/severity/associated sxs/prior Treatment) HPI  Patient presents to the ED with complaints of abdominal pains since yesterday to the right side of her abdomen. She is obese and is unsure if it is right upper or right lower. She says that she has felt nauseous but has not had diarrhea or vomiting as of yet. She has not had any intraabdominal surgeries. She was seen at [facility] and sent to the ED for a CT scan of the abd/pelv. She is denying fevers. The pain waxes and wanes. Feels like a full/sharp pain. She denies dysuria, hematuria, rectal or vaginal bleeding. No vaginal dsicharge. Denies fevers, weakness, confusion. Pain is currently a 4/10   Past Medical History  Diagnosis Date  . Obesity   . Pre-diabetes    Past Surgical History  Procedure Laterality Date  . Spinal fusion  2004  . Back surgery  2004  . Uterine ablation  2010   Family History  Problem Relation Age of Onset  . Heart Problems Father    History  Substance Use Topics  . Smoking status: Never Smoker   . Smokeless tobacco: Never Used  . Alcohol Use: No   OB History   Grav Para Term Preterm Abortions TAB SAB Ect Mult Living                 Review of Systems  The patient denies anorexia, fever, weight loss,, vision loss, decreased hearing, hoarseness, chest pain, syncope, dyspnea on exertion, peripheral edema, balance deficits, hemoptysis, melena, hematochezia, severe indigestion/heartburn, hematuria, incontinence, genital sores, muscle weakness, suspicious skin lesions, transient blindness, difficulty walking, depression, unusual weight change, abnormal bleeding, enlarged lymph nodes, angioedema, and breast masses.   Allergies  Other and Bee venom  Home Medications    Current Outpatient Rx  Name  Route  Sig  Dispense  Refill  . albuterol (PROVENTIL HFA;VENTOLIN HFA) 108 (90 BASE) MCG/ACT inhaler   Inhalation   Inhale 1-2 puffs into the lungs every 6 (six) hours as needed for wheezing.   1 Inhaler   0   . oxyCODONE-acetaminophen (PERCOCET/ROXICET) 5-325 MG per tablet   Oral   Take 1-2 tablets by mouth every 6 (six) hours as needed for severe pain.   20 tablet   0    BP 117/78  Pulse 95  Temp(Src) 98.4 F (36.9 C) (Oral)  Resp 20  Ht 5\' 6"  (1.676 m)  Wt 302 lb 3.2 oz (137.077 kg)  BMI 48.80 kg/m2  SpO2 97% Physical Exam  Nursing note and vitals reviewed. Constitutional: She appears well-developed and well-nourished. No distress.  HENT:  Head: Normocephalic and atraumatic.  Eyes: Pupils are equal, round, and reactive to light.  Neck: Normal range of motion. Neck supple.  Cardiovascular: Normal rate and regular rhythm.   Pulmonary/Chest: Effort normal.  Abdominal: Soft. Bowel sounds are normal. She exhibits no ascites. There is tenderness (RUQ and RLQ). There is no rigidity, no rebound, no guarding and no CVA tenderness.  Abdominal exam limited by body habitus  Neurological: She is alert.  Skin: Skin is warm and dry.    ED Course  Procedures (including critical care time) Labs Review Labs Reviewed  URINALYSIS, ROUTINE W REFLEX MICROSCOPIC - Abnormal; Notable for the following:  Glucose, UA >1000 (*)    All other components within normal limits  CBC WITH DIFFERENTIAL - Abnormal; Notable for the following:    WBC 11.1 (*)    Basophils Absolute 0.2 (*)    All other components within normal limits  COMPREHENSIVE METABOLIC PANEL - Abnormal; Notable for the following:    Glucose, Bld 365 (*)    ALT 42 (*)    All other components within normal limits  URINE MICROSCOPIC-ADD ON - Abnormal; Notable for the following:    Squamous Epithelial / LPF FEW (*)    All other components within normal limits  LIPASE, BLOOD   Imaging  Review Ct Abdomen Pelvis W Contrast  05/12/2013   CLINICAL DATA:  One day history of right lower quadrant abdominal pain and nausea.  EXAM: CT ABDOMEN AND PELVIS WITH CONTRAST  TECHNIQUE: Multidetector CT imaging of the abdomen and pelvis was performed using the standard protocol following bolus administration of intravenous contrast.  CONTRAST:  100mL OMNIPAQUE IOHEXOL 300 MG/ML IV. Oral contrast was also administered.  COMPARISON:  CT abdomen and pelvis 11/22/2010.  FINDINGS: Normal-appearing decompressed appendix in the right upper pelvis. Scattered subcentimeter lymph nodes in the mesentery of the right lower quadrant adjacent to the cecum. Expected stool burden throughout the normal appearing colon. Stomach normal in appearance, containing food. Small bowel normal in appearance. No ascites.  Diffuse hepatic steatosis without focal hepatic parenchymal abnormality. Anatomic variant in that the left lobe of the liver extends well across the midline into the left upper quadrant of the abdomen. Normal-appearing spleen, pancreas, adrenal glands, kidneys, and gallbladder. No biliary ductal dilation. Normal vascular structures. No significant lymphadenopathy.  Normal-appearing uterus and ovaries. No adnexal masses or free pelvic fluid. Urinary bladder decompressed and unremarkable. Phleboliths low in the pelvis.  Bone window images demonstrate prior L4-5 fusion without complication and lower thoracic spondylosis. Visualized lung bases clear. Heart size normal.  IMPRESSION: 1. No evidence of acute appendicitis. 2. Subcentimeter lymph nodes in the mesentery adjacent to the cecum which can occasionally be seen in patients with mesenteric adenitis. 3. Diffuse hepatic steatosis without focal hepatic parenchymal abnormality. 4. Otherwise normal examination.   Electronically Signed   By: Hulan Saashomas  Lawrence M.D.   On: 05/12/2013 21:39    EKG Interpretation   None       MDM   Final diagnoses:  Mesenteric adenitis     IV saline 1L initiated as well as MOrphine and Zofran. Blood work has returned with normal renal, kidney and lipase. Will order CT abd/pelv to r/o acute intraabdominal pathology.  Patient refused the Morphine because she wants to be able to drive home. Her CT scan shows mesenteric adenitis, which UpToDate says is most commonly viral. She has not had diarrhea or vomiting and is afebrile, I feel comfortable not treating this as a bacterial infection for now.  Will Rx pain meds. She reports having Zofran at home.  38 y.o.[name]'s evaluation in the Emergency Department is complete. It has been determined that no acute conditions requiring further emergency intervention are present at this time. The patient/guardian have been advised of the diagnosis and plan. We have discussed signs and symptoms that warrant return to the ED, such as changes or worsening in symptoms.  Vital signs are stable at discharge. Filed Vitals:   05/12/13 2200  BP: 117/78  Pulse: 95  Temp:   Resp:     Patient/guardian has voiced understanding and agreed to follow-up with the PCP or specialist.  Dorthula Matas, PA-C 05/12/13 2208

## 2013-05-12 NOTE — ED Notes (Signed)
The pt is c/o rt abd pai since yesterday with nausea no v or diarrhea.  lmp none.  She was seen at an urgent care

## 2013-05-13 NOTE — ED Provider Notes (Signed)
Medical screening examination/treatment/procedure(s) were performed by non-physician practitioner and as supervising physician I was immediately available for consultation/collaboration.  EKG Interpretation   None         William Nicolaas Savo, MD 05/13/13 1505 

## 2013-11-26 ENCOUNTER — Emergency Department (INDEPENDENT_AMBULATORY_CARE_PROVIDER_SITE_OTHER)
Admission: EM | Admit: 2013-11-26 | Discharge: 2013-11-26 | Disposition: A | Discharge status: Home / Self Care | Payer: BC Managed Care – PPO | Source: Home / Self Care | Attending: Emergency Medicine | Admitting: Emergency Medicine

## 2013-11-26 ENCOUNTER — Encounter (HOSPITAL_COMMUNITY): Payer: Self-pay | Admitting: Emergency Medicine

## 2013-11-26 ENCOUNTER — Emergency Department (INDEPENDENT_AMBULATORY_CARE_PROVIDER_SITE_OTHER): Payer: BC Managed Care – PPO

## 2013-11-26 DIAGNOSIS — S6000XA Contusion of unspecified finger without damage to nail, initial encounter: Secondary | ICD-10-CM

## 2013-11-26 DIAGNOSIS — Z23 Encounter for immunization: Secondary | ICD-10-CM

## 2013-11-26 DIAGNOSIS — W230XXA Caught, crushed, jammed, or pinched between moving objects, initial encounter: Secondary | ICD-10-CM

## 2013-11-26 DIAGNOSIS — S60011A Contusion of right thumb without damage to nail, initial encounter: Secondary | ICD-10-CM

## 2013-11-26 MED ORDER — IBUPROFEN 800 MG PO TABS
800.0000 mg | ORAL_TABLET | Freq: Once | ORAL | Status: AC
  Administered 2013-11-26: 800 mg via ORAL

## 2013-11-26 MED ORDER — TETANUS-DIPHTH-ACELL PERTUSSIS 5-2.5-18.5 LF-MCG/0.5 IM SUSP
0.5000 mL | Freq: Once | INTRAMUSCULAR | Status: AC
  Administered 2013-11-26: 0.5 mL via INTRAMUSCULAR

## 2013-11-26 MED ORDER — TETANUS-DIPHTHERIA TOXOIDS TD 5-2 LFU IM INJ: 0.5000 mL | INJECTION | Freq: Once | INTRAMUSCULAR | Status: DC

## 2013-11-26 MED ORDER — TETANUS-DIPHTH-ACELL PERTUSSIS 5-2.5-18.5 LF-MCG/0.5 IM SUSP
INTRAMUSCULAR | Status: AC
  Filled 2013-11-26: qty 0.5

## 2013-11-26 MED ORDER — IBUPROFEN 800 MG PO TABS
ORAL_TABLET | ORAL | Status: AC
  Filled 2013-11-26: qty 1

## 2013-11-26 NOTE — ED Notes (Signed)
Pt   Reports         She  Slammed  Her  Thumb  In a  Car  Door         sev  Days  Ago       Pain  And  Swelling  Present

## 2013-11-26 NOTE — Discharge Instructions (Signed)
Your xrays were normal, however, you can expect to be stif and sore for the next several days. Wear splint as needed for comfort while awake and take ibuprofen as directed on packaging for pain. Ice and needed for swelling and discomfort. Make sure you perform gentle range of motion of injured thumb several time a day during healing process. If symptoms do not begin to improve over the next 1-2 weeks, please follow up with the hand orthopedist listed on your discharge paperwork for re-evaluation (Dr. Karlyn Agee).  Contusion A contusion is a deep bruise. Contusions are the result of an injury that caused bleeding under the skin. The contusion may turn blue, purple, or yellow. Minor injuries will give you a painless contusion, but more severe contusions may stay painful and swollen for a few weeks.  CAUSES  A contusion is usually caused by a blow, trauma, or direct force to an area of the body. SYMPTOMS   Swelling and redness of the injured area.  Bruising of the injured area.  Tenderness and soreness of the injured area.  Pain. DIAGNOSIS  The diagnosis can be made by taking a history and physical exam. An X-ray, CT scan, or MRI may be needed to determine if there were any associated injuries, such as fractures. TREATMENT  Specific treatment will depend on what area of the body was injured. In general, the best treatment for a contusion is resting, icing, elevating, and applying cold compresses to the injured area. Over-the-counter medicines may also be recommended for pain control. Ask your caregiver what the best treatment is for your contusion. HOME CARE INSTRUCTIONS   Put ice on the injured area.  Put ice in a plastic bag.  Place a towel between your skin and the bag.  Leave the ice on for 15-20 minutes, 3-4 times a day, or as directed by your health care provider.  Only take over-the-counter or prescription medicines for pain, discomfort, or fever as directed by your caregiver. Your  caregiver may recommend avoiding anti-inflammatory medicines (aspirin, ibuprofen, and naproxen) for 48 hours because these medicines may increase bruising.  Rest the injured area.  If possible, elevate the injured area to reduce swelling. SEEK IMMEDIATE MEDICAL CARE IF:   You have increased bruising or swelling.  You have pain that is getting worse.  Your swelling or pain is not relieved with medicines. MAKE SURE YOU:   Understand these instructions.  Will watch your condition.  Will get help right away if you are not doing well or get worse. Document Released: 12/17/2004 Document Revised: 03/14/2013 Document Reviewed: 01/12/2011 [facility] Patient Information 2015 [place], [place]. This information is not intended to replace advice given to you by your health care provider. Make sure you discuss any questions you have with your health care provider.

## 2013-11-26 NOTE — ED Provider Notes (Signed)
CSN: [id number]     Arrival date & time 11/26/13  1819 History   First MD Initiated [id number] Patient 11/26/13 1833     Chief Complaint  Patient presents with  . Hand Injury   (Consider location/radiation/quality/duration/timing/severity/associated sxs/prior Treatment) HPI Comments: Patient reports she accidentally shut her right thumb in a car door today and she suffered a small superficial (2mm) laceration and has persistent pain with ROM of right thumb.   The history is provided by the patient.    Past Medical History  Diagnosis Date  . Obesity   . Pre-diabetes    Past Surgical History  Procedure Laterality Date  . Spinal fusion  2004  . Back surgery  2004  . Uterine ablation  2010   Family History  Problem Relation Age of Onset  . Heart Problems Father    History  Substance Use Topics  . Smoking status: Never Smoker   . Smokeless tobacco: Never Used  . Alcohol Use: No   OB History   Grav Para Term Preterm Abortions TAB SAB Ect Mult Living                 Review of Systems  All other systems reviewed and are negative.   Allergies  Other and Bee venom  Home Medications   Prior to Admission medications   Medication Sig Start Date End Date Taking? Authorizing Provider  BuPROPion HCl (WELLBUTRIN PO) Take by mouth.   Yes Historical Provider, MD  METFORMIN HCL PO Take by mouth.   Yes Historical Provider, MD  SitaGLIPtin Phosphate (JANUVIA PO) Take by mouth.   Yes Historical Provider, MD  albuterol (PROVENTIL HFA;VENTOLIN HFA) 108 (90 BASE) MCG/ACT inhaler Inhale 1-2 puffs into the lungs every 6 (six) hours as needed for wheezing. 08/29/12   Adlih Moreno-Coll, MD  oxyCODONE-acetaminophen (PERCOCET/ROXICET) 5-325 MG per tablet Take 1-2 tablets by mouth every 6 (six) hours as needed for severe pain. 05/12/13   Tiffany Irine Seal, PA-C   BP 111/82  Pulse 101  Temp(Src) 98.9 F (37.2 C) (Oral)  Resp 16  SpO2 100% Physical Exam  Nursing note and vitals  reviewed. Constitutional: She appears well-developed and well-nourished. No distress.  HENT:  Head: Normocephalic and atraumatic.  Cardiovascular: Normal rate.   Pulmonary/Chest: Effort normal.  Musculoskeletal:       Hands: Skin: Skin is warm and dry.  2mm superficial laceration at dorsal surface of right thumb DIP joint. No active bleeding    ED Course  Procedures (including critical care time) Labs Review Labs Reviewed - No data to display  Imaging Review Dg Finger Thumb Right  11/26/2013   CLINICAL DATA:  Slammed car door and thumb.  Right thumb pain.  EXAM: RIGHT THUMB 2+V  COMPARISON:  None.  FINDINGS: There is no evidence of fracture or dislocation. There is no evidence of arthropathy or other focal bone abnormality. Soft tissues are unremarkable  IMPRESSION: Negative.   Electronically Signed   By: Myles Rosenthal M.D.   On: 11/26/2013 18:47     MDM   1. Contusion of right thumb, initial encounter    Tetanus status unknown. Patient given tetanus booster at [facility]. Films without evidence of fracture or dislocation. Patient placed in velcro thumb spica splint. Advised to use RICE therapy, NSAIDS at home with hand ortho follow up (Dr. Karlyn Agee) if no improvement over next 1-2 weeks.     Mathis Fare [place], [place] 11/27/13 [phone]

## 2013-11-27 NOTE — ED Provider Notes (Signed)
Medical screening examination/treatment/procedure(s) were performed by non-physician practitioner and as supervising physician I was immediately available for consultation/collaboration.  Leslee Home, M.D.   Reuben Likes, MD 11/27/13 [phone]

## 2014-02-28 ENCOUNTER — Encounter: Payer: Self-pay | Admitting: Endocrinology

## 2014-03-20 ENCOUNTER — Other Ambulatory Visit: Payer: Self-pay | Admitting: Orthopedic Surgery

## 2014-03-20 ENCOUNTER — Ambulatory Visit
Admission: RE | Admit: 2014-03-20 | Discharge: 2014-03-20 | Disposition: A | Discharge status: Home / Self Care | Payer: BC Managed Care – PPO | Source: Ambulatory Visit | Attending: Orthopedic Surgery | Admitting: Orthopedic Surgery

## 2014-03-20 DIAGNOSIS — M25571 Pain in right ankle and joints of right foot: Secondary | ICD-10-CM

## 2014-08-03 ENCOUNTER — Emergency Department (HOSPITAL_COMMUNITY)
Admission: EM | Admit: 2014-08-03 | Discharge: 2014-08-03 | Disposition: A | Discharge status: Home / Self Care | Payer: BLUE CROSS/BLUE SHIELD | Attending: Emergency Medicine | Admitting: Emergency Medicine

## 2014-08-03 ENCOUNTER — Emergency Department (HOSPITAL_COMMUNITY): Payer: BLUE CROSS/BLUE SHIELD

## 2014-08-03 ENCOUNTER — Encounter (HOSPITAL_COMMUNITY): Payer: Self-pay | Admitting: Family Medicine

## 2014-08-03 DIAGNOSIS — R0789 Other chest pain: Secondary | ICD-10-CM

## 2014-08-03 DIAGNOSIS — Z79899 Other long term (current) drug therapy: Secondary | ICD-10-CM | POA: Insufficient documentation

## 2014-08-03 DIAGNOSIS — R0602 Shortness of breath: Secondary | ICD-10-CM | POA: Insufficient documentation

## 2014-08-03 DIAGNOSIS — E669 Obesity, unspecified: Secondary | ICD-10-CM | POA: Insufficient documentation

## 2014-08-03 DIAGNOSIS — R079 Chest pain, unspecified: Secondary | ICD-10-CM

## 2014-08-03 LAB — COMPREHENSIVE METABOLIC PANEL
ALT: 29 U/L (ref 14–54)
AST: 36 U/L (ref 15–41)
Albumin: 3 g/dL — ABNORMAL LOW (ref 3.5–5.0)
Alkaline Phosphatase: 71 U/L (ref 38–126)
Anion gap: 7 (ref 5–15)
BUN: 7 mg/dL (ref 6–20)
CO2: 23 mmol/L (ref 22–32)
CREATININE: 0.8 mg/dL (ref 0.44–1.00)
Calcium: 8.8 mg/dL — ABNORMAL LOW (ref 8.9–10.3)
Chloride: 104 mmol/L (ref 101–111)
GFR calc Af Amer: >60 mL/min (ref >60)
GFR calc non Af Amer: >60 mL/min (ref >60)
Glucose, Bld: 308 mg/dL — ABNORMAL HIGH (ref 65–99)
Potassium: 5.1 mmol/L (ref 3.5–5.1)
SODIUM: 134 mmol/L — AB (ref 135–145)
Total Bilirubin: 1.9 mg/dL — ABNORMAL HIGH (ref 0.3–1.2)
Total Protein: 6.3 g/dL — ABNORMAL LOW (ref 6.5–8.1)

## 2014-08-03 LAB — CBC WITH DIFFERENTIAL/PLATELET
BASOS ABS: 0 10*3/uL (ref 0.0–0.1)
Basophils Relative: 0 % (ref 0–1)
EOS ABS: 0.2 10*3/uL (ref 0.0–0.7)
EOS PCT: 2 % (ref 0–5)
HCT: 40.5 % (ref 36.0–46.0)
HEMOGLOBIN: 13.6 g/dL (ref 12.0–15.0)
LYMPHS PCT: 25 % (ref 12–46)
Lymphs Abs: 2.7 10*3/uL (ref 0.7–4.0)
MCH: 28.8 pg (ref 26.0–34.0)
MCHC: 33.6 g/dL (ref 30.0–36.0)
MCV: 85.8 fL (ref 78.0–100.0)
Monocytes Absolute: 0.5 10*3/uL (ref 0.1–1.0)
Monocytes Relative: 5 % (ref 3–12)
Neutro Abs: 7.4 10*3/uL (ref 1.7–7.7)
Neutrophils Relative %: 68 % (ref 43–77)
Platelets: 366 10*3/uL (ref 150–400)
RBC: 4.72 MIL/uL (ref 3.87–5.11)
RDW: 12.5 % (ref 11.5–15.5)
WBC: 10.8 10*3/uL — ABNORMAL HIGH (ref 4.0–10.5)

## 2014-08-03 LAB — I-STAT TROPONIN, ED: TROPONIN I, POC: 0 ng/mL (ref 0.00–0.08)

## 2014-08-03 LAB — D-DIMER, QUANTITATIVE: D-Dimer, Quant: 0.44 ug/mL-FEU (ref 0.00–0.48)

## 2014-08-03 MED ORDER — SODIUM CHLORIDE 0.9 % IV BOLUS (SEPSIS)
500.0000 mL | Freq: Once | INTRAVENOUS | Status: AC
  Administered 2014-08-03: 500 mL via INTRAVENOUS

## 2014-08-03 NOTE — ED Provider Notes (Signed)
CSN: [id number]     Arrival date & time 08/03/14  0941 History   First MD Initiated Contact with Patient 08/03/14 [phone]     Chief Complaint  Patient presents with  . Chest Pain  . Shortness of Breath     (Consider location/radiation/quality/duration/timing/severity/associated sxs/prior Treatment) Patient is a 39 y.o. female presenting with chest pain and shortness of breath. The history is provided by the patient.  Chest Pain Pain location:  L chest and R chest Pain quality: pressure   Pain radiates to:  Neck and R jaw Pain radiates to the back: no   Pain severity:  Mild Onset quality:  Sudden Duration:  7 hours Timing:  Intermittent Progression:  Waxing and waning Chronicity:  New Context: at rest   Relieved by:  Nothing Worsened by:  Nothing tried Ineffective treatments:  None tried Associated symptoms: no abdominal pain, no back pain, no cough, no dizziness, no fatigue, no fever, no headache, no nausea, no shortness of breath and not vomiting   Shortness of Breath Associated symptoms: no abdominal pain, no chest pain, no cough, no fever, no headaches, no neck pain and no vomiting     Past Medical History  Diagnosis Date  . Obesity   . Pre-diabetes    Past Surgical History  Procedure Laterality Date  . Spinal fusion  2004  . Back surgery  2004  . Uterine ablation  2010   Family History  Problem Relation Age of Onset  . Heart Problems Father    History  Substance Use Topics  . Smoking status: Never Smoker   . Smokeless tobacco: Never Used  . Alcohol Use: No   OB History    No data available     Review of Systems  Constitutional: Negative for fever and fatigue.  HENT: Negative for congestion and drooling.   Eyes: Negative for pain.  Respiratory: Negative for cough and shortness of breath.   Cardiovascular: Negative for chest pain.  Gastrointestinal: Negative for nausea, vomiting, abdominal pain and diarrhea.  Genitourinary: Negative for dysuria and  hematuria.  Musculoskeletal: Negative for back pain, gait problem and neck pain.  Skin: Negative for color change.  Neurological: Negative for dizziness and headaches.  Hematological: Negative for adenopathy.  Psychiatric/Behavioral: Negative for behavioral problems.  All other systems reviewed and are negative.     Allergies  Other and Bee venom  Home Medications   Prior to Admission medications   Medication Sig Start Date End Date Taking? Authorizing Provider  albuterol (PROVENTIL HFA;VENTOLIN HFA) 108 (90 BASE) MCG/ACT inhaler Inhale 1-2 puffs into the lungs every 6 (six) hours as needed for wheezing. 08/29/12   Adlih Moreno-Coll, MD  BuPROPion HCl (WELLBUTRIN PO) Take by mouth.    Historical Provider, MD  METFORMIN HCL PO Take by mouth.    Historical Provider, MD  oxyCODONE-acetaminophen (PERCOCET/ROXICET) 5-325 MG per tablet Take 1-2 tablets by mouth every 6 (six) hours as needed for severe pain. 05/12/13   Tiffany Neva SeatGreene, PA-C  SitaGLIPtin Phosphate (JANUVIA PO) Take by mouth.    Historical Provider, MD   BP 120/58 mmHg  Pulse 100  Resp 14  SpO2 100% Physical Exam  Constitutional: She is oriented to person, place, and time. She appears well-developed and well-nourished.  HENT:  Head: Normocephalic.  Mouth/Throat: No oropharyngeal exudate.  Eyes: Conjunctivae and EOM are normal. Pupils are equal, round, and reactive to light.  Neck: Normal range of motion. Neck supple.  No carotid bruits auscultated.  Cardiovascular: Normal rate, regular  rhythm, normal heart sounds and intact distal pulses.  Exam reveals no gallop and no friction rub.   No murmur heard. Pulmonary/Chest: Effort normal and breath sounds normal. No respiratory distress. She has no wheezes.  Abdominal: Soft. Bowel sounds are normal. There is no tenderness. There is no rebound and no guarding.  Musculoskeletal: Normal range of motion. She exhibits no edema or tenderness.  Normal-appearing lower extremities  without asymmetry or focal tenderness.  Neurological: She is alert and oriented to person, place, and time.  Normal strength and sensation in all extremities.   CN's 2-12 intact.   Skin: Skin is warm and dry.  Psychiatric: She has a normal mood and affect. Her behavior is normal.  Nursing note and vitals reviewed.   ED Course  Procedures (including critical care time) Labs Review Labs Reviewed  CBC WITH DIFFERENTIAL/PLATELET - Abnormal; Notable for the following:    WBC 10.8 (*)    All other components within normal limits  COMPREHENSIVE METABOLIC PANEL - Abnormal; Notable for the following:    Sodium 134 (*)    Glucose, Bld 308 (*)    Calcium 8.8 (*)    Total Protein 6.3 (*)    Albumin 3.0 (*)    Total Bilirubin 1.9 (*)    All other components within normal limits  D-DIMER, QUANTITATIVE  I-STAT TROPOININ, ED  POC URINE PREG, ED    Imaging Review Dg Chest 2 View  08/03/2014   CLINICAL DATA:  One day history of chest pain and shortness of breath  EXAM: CHEST  2 VIEW  COMPARISON:  November 22, 2010  FINDINGS: Lungs are clear. Heart size and pulmonary vascularity are normal. No adenopathy. No bone lesions.  IMPRESSION: No edema or consolidation.   Electronically Signed   By: Bretta BangWilliam  Woodruff III M.D.   On: 08/03/2014 10:16     EKG Interpretation   Date/Time:  Friday Aug 03 2014 09:49:49 EDT Ventricular Rate:  88 PR Interval:  144 QRS Duration: 74 QT Interval:  367 QTC Calculation: 444 R Axis:   61 Text Interpretation:  Sinus rhythm Low voltage, precordial leads  Borderline T abnormalities, inferior leads Baseline wander in lead(s) II  III aVF V5 V6 No significant change since last tracing Confirmed by  Racine Erby  MD, Tiburcio Linder ([id number]) on 08/03/2014 10:05:54 AM      MDM   Final diagnoses:  Other chest pain    10:08 AM 39 y.o. female w hx of DM on metformin who presents with chest pressure which awoke her from sleep around 3 and this morning. She notes it lasted about  30 minutes and then seemed to resolve. She awoke several hours later with the pressure again which seems to be intermittent. She also notes some mild shortness of breath with ambulation. She denies any chest pain or shortness of breath currently on exam. She does feel like she has some generalized weakness. Heart rate of 100, vital signs otherwise unremarkable. Low risk per [place]. We'll get screening lab work including d-dimer.  12:27 PM: Pt continues to appear well. No cp or sob on exam. VSS. I interpreted/reviewed the labs and/or imaging which were non-contributory.  Low risk for MACE per HEART score.  I have discussed the diagnosis/risks/treatment options with the patient and family and believe the pt to be eligible for discharge home to follow-up with her pcp in 2-3 days to discuss further workup. We also discussed returning to the ED immediately if new or worsening sx occur. We discussed the  sx which are most concerning (e.g., worsening cp/sob, fever) that necessitate immediate return. Medications administered to the patient during their visit and any new prescriptions provided to the patient are listed below.  Medications given during this visit Medications  sodium chloride 0.9 % bolus 500 mL (0 mLs Intravenous Stopped 08/03/14 1112)    New Prescriptions   No medications on file     Purvis Sheffield, MD 08/03/14 1228

## 2014-08-03 NOTE — ED Notes (Signed)
MD Harrison at bedside. 

## 2014-08-03 NOTE — Discharge Instructions (Signed)

## 2014-08-03 NOTE — ED Notes (Signed)
Pt here for fullness in chest, SOB, facial pain since 2 am. sts very weak and hard to walk without getting tired.

## 2014-08-03 NOTE — ED Notes (Signed)
Pt placed in gown and in bed. Pt monitored by pulse ox, bp cuff, and 12-lead. 

## 2014-10-11 ENCOUNTER — Ambulatory Visit (INDEPENDENT_AMBULATORY_CARE_PROVIDER_SITE_OTHER): Payer: BLUE CROSS/BLUE SHIELD | Admitting: Psychiatry

## 2014-10-11 VITALS — BP 127/85 | HR 79 | Ht 66.0 in | Wt 257.0 lb

## 2014-10-11 DIAGNOSIS — F330 Major depressive disorder, recurrent, mild: Secondary | ICD-10-CM

## 2014-10-11 DIAGNOSIS — F331 Major depressive disorder, recurrent, moderate: Secondary | ICD-10-CM

## 2014-10-11 MED ORDER — CITALOPRAM HYDROBROMIDE 40 MG PO TABS: 40.0000 mg | ORAL_TABLET | Freq: Every day | ORAL | Status: DC

## 2014-10-11 MED ORDER — BUPROPION HCL ER (XL) 300 MG PO TB24: 300.0000 mg | ORAL_TABLET | Freq: Every day | ORAL | Status: DC

## 2014-10-11 MED ORDER — BUSPIRONE HCL 10 MG PO TABS: ORAL_TABLET | ORAL | Status: DC
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
## 2014-10-12 ENCOUNTER — Telehealth (HOSPITAL_COMMUNITY): Payer: Self-pay

## 2014-10-12 NOTE — Telephone Encounter (Signed)
Telephone call with Julian Hy, pharmacist at [facility] in [place] to verify Dr. Caprice Renshaw dosage of prescribed Citalopram from order 10/11/14 should be , one in the morning.  Reviewed patient record and note from Dr. Donell Beers to verify this was changed to  one in the morning at his evaluation with patient on 10/11/14.  Pharmacist agreed to fill order as instructions were clarified.

## 2014-12-13 ENCOUNTER — Ambulatory Visit (INDEPENDENT_AMBULATORY_CARE_PROVIDER_SITE_OTHER): Payer: BLUE CROSS/BLUE SHIELD | Admitting: Psychiatry

## 2014-12-13 DIAGNOSIS — F331 Major depressive disorder, recurrent, moderate: Secondary | ICD-10-CM

## 2014-12-13 DIAGNOSIS — F330 Major depressive disorder, recurrent, mild: Secondary | ICD-10-CM

## 2014-12-13 MED ORDER — CITALOPRAM HYDROBROMIDE 40 MG PO TABS: 40.0000 mg | ORAL_TABLET | Freq: Every day | ORAL | Status: DC

## 2014-12-13 MED ORDER — BUSPIRONE HCL 15 MG PO TABS: ORAL_TABLET | ORAL | Status: DC

## 2014-12-13 MED ORDER — BUPROPION HCL ER (XL) 300 MG PO TB24: 300.0000 mg | ORAL_TABLET | Freq: Every day | ORAL | Status: DC
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
## 2015-02-20 ENCOUNTER — Ambulatory Visit (HOSPITAL_COMMUNITY): Payer: Self-pay | Admitting: Psychiatry

## 2015-02-27 ENCOUNTER — Ambulatory Visit (INDEPENDENT_AMBULATORY_CARE_PROVIDER_SITE_OTHER): Payer: BLUE CROSS/BLUE SHIELD | Admitting: Psychiatry

## 2015-02-27 VITALS — BP 131/78 | HR 88 | Ht 67.0 in | Wt 260.0 lb

## 2015-02-27 DIAGNOSIS — F331 Major depressive disorder, recurrent, moderate: Secondary | ICD-10-CM | POA: Diagnosis not present

## 2015-02-27 MED ORDER — BUPROPION HCL ER (XL) 300 MG PO TB24: 300.0000 mg | ORAL_TABLET | Freq: Every day | ORAL | Status: DC

## 2015-02-27 MED ORDER — CITALOPRAM HYDROBROMIDE 40 MG PO TABS: 40.0000 mg | ORAL_TABLET | Freq: Every day | ORAL | Status: DC

## 2015-02-27 NOTE — Progress Notes (Signed)
[facility] MD Progress Note  02/27/2015 4:16 PM [name]  MRN:  [id number] Subjective:  Moderately anxious Principal Problem: Anxious Diagnosis:  Major depression, recurrent Today's patient says that she is anxious about finances. 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 The patient worries a lot about financial problems because her partner use a therapist is just beginning in therapy just beginning to make decent wage. The patient actually is sleeping and eating well. She's got good energy. She really wants TV enjoys playing with her daughter 37 years old. The patient is not psychotic. Is not clear if the increase of BuSpar is made much of a difference but I do not think this patient is dysfunctional from her anxiety. She continues to work well and is not overwhelmed with anxiety. Overall she knowledge is that her mood is much better taking the Wellbutrin and Celexa. Her last visit we reduced the Celexa to the appropriate dose of 40 mg and increased her Wellbutrin according to the dose to 300 mg. The patient will continue in therapy with Merla Riches. Patient Active Problem List   Diagnosis Date Noted  . Major depressive disorder, recurrent episode, moderate (HCC) [F33.1] 06/20/2012    Class: Chronic   Total Time spent with patient: 15 minutes  Past Psychiatric History:   Past Medical History:  Past Medical History  Diagnosis Date  . Obesity   . Pre-diabetes     Past Surgical History  Procedure Laterality Date  . Spinal fusion  2004  . Back surgery  2004  . Uterine ablation  2010   Family History:  Family History  Problem Relation Age of Onset  . Heart Problems Father    Family Psychiatric  History:  Social History:  History  Alcohol Use No     History  Drug Use No    Social History   Social History  . Marital Status: Single    Spouse Name: N/A  . Number of Children: N/A  . Years of Education: N/A   Social History Main Topics  . Smoking status:  Never Smoker   . Smokeless tobacco: Never Used  . Alcohol Use: No  . Drug Use: No  . Sexual Activity: Yes   Other Topics Concern  . Not on file   Social History Narrative   Additional Social History:                         Sleep: Good  Appetite:  Good  Current Medications: Current Outpatient Prescriptions  Medication Sig Dispense Refill  . albuterol (PROVENTIL HFA;VENTOLIN HFA) 108 (90 BASE) MCG/ACT inhaler Inhale 1-2 puffs into the lungs every 6 (six) hours as needed for wheezing. (Patient taking differently: Inhale 2 puffs into the lungs every 6 (six) hours as needed for wheezing. ) 1 Inhaler 0  . buPROPion (WELLBUTRIN XL) 300 MG 24 hr tablet Take 1 tablet (300 mg total) by mouth daily. 30 tablet 4  . buPROPion (WELLBUTRIN) 100 MG tablet Take 100 mg by mouth 2 (two) times daily.  4  . busPIRone (BUSPAR) 15 MG tablet 2 qam  1  q diner 90 tablet 5  . citalopram (CELEXA) 40 MG tablet Take 1 tablet (40 mg total) by mouth at bedtime. 1  qam 30 tablet 5  . metFORMIN (GLUCOPHAGE) 1000 MG tablet Take 1,000 mg by mouth 2 (two) times daily.  4  . oxyCODONE-acetaminophen (PERCOCET/ROXICET) 5-325 MG per tablet Take  1-2 tablets by mouth every 6 (six) hours as needed for severe pain. (Patient not taking: Reported on 08/03/2014) 20 tablet 0   No current facility-administered medications for this visit.    Lab Results: No results found for this or any previous visit (from the past 48 hour(s)).  Physical Findings: AIMS:  , ,  ,  ,    CIWA:    COWS:     Musculoskeletal: Strength & Muscle Tone: within normal limits Gait & Station: normal Patient leans: Right  Psychiatric Specialty Exam: ROS  Blood pressure 131/78, pulse 88, height 5\' 7"  (1.702 m), weight 260 lb (117.935 kg).Body mass index is 40.71 kg/(m^2).  General Appearance: Casual  Eye Contact::  Good  Speech:  Clear and Coherent  Volume:  Normal  Mood:  NA  Affect:  Congruent  Thought Process:  Coherent   Orientation:  Full (Time, Place, and Person)  Thought Content:  WDL  Suicidal Thoughts:  No  Homicidal Thoughts:  No  Memory:  NA  Judgement:  Good  Insight:  Fair  Psychomotor Activity:  Normal  Concentration:  Good  Recall:  Good  Fund of Knowledge:Good  Language: Good  Akathisia:  No  Handed:  Right  AIMS (if indicated):     Assets:  Desire for Improvement  ADL's:  Intact  Cognition: WNL  Sleep:      Treatment Plan Summary: At this time the patient will continue taking Wellbutrin 300 mg, Celexa 40 mg and BuSpar 15 mg 3 times a day. I will go ahead and allowed FMLA form again this time for a year instead of just 3 months. The patient will stay in therapy. The patient is not suicidal. She is functioning fairly well. She wants anxiety medicines do better but BuSpar is at its maximum dose. The patient is hesitant to take her benzodiazepines and I'm somewhat hesitant to offer it. This patient return to see me in 3 months.  Afreen Siebels IRVING 02/27/2015, 4:16 PM

## 2015-05-29 ENCOUNTER — Encounter (HOSPITAL_COMMUNITY): Payer: Self-pay | Admitting: Psychiatry

## 2015-05-29 ENCOUNTER — Ambulatory Visit (INDEPENDENT_AMBULATORY_CARE_PROVIDER_SITE_OTHER): Payer: BLUE CROSS/BLUE SHIELD | Admitting: Psychiatry

## 2015-05-29 VITALS — BP 120/86 | HR 85 | Ht 66.0 in | Wt 277.4 lb

## 2015-05-29 DIAGNOSIS — F339 Major depressive disorder, recurrent, unspecified: Secondary | ICD-10-CM

## 2015-05-29 MED ORDER — BUSPIRONE HCL 15 MG PO TABS: ORAL_TABLET | ORAL | Status: DC

## 2015-05-29 MED ORDER — CITALOPRAM HYDROBROMIDE 40 MG PO TABS: 40.0000 mg | ORAL_TABLET | Freq: Every day | ORAL | Status: DC

## 2015-05-29 MED ORDER — BUPROPION HCL ER (XL) 300 MG PO TB24: 300.0000 mg | ORAL_TABLET | Freq: Every day | ORAL | Status: DC

## 2015-05-29 NOTE — Progress Notes (Signed)
[facility]  05/29/2015 4:26 PM [name]  MRN:  [id number] Subjective: Feeling well Principal Problem: Major depression, recurrent episode remission Diagnosis:  Major depression, recurrent episode remission Today the patient is doing very well. Work has improved. 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 I think she likes it better. 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 She believes it is a secure job but she does not can reimbursed adequately. The patient denies persistent daily depression at this time. She claims she sleeping and eating well. She's got good energy. She still enjoys her environment. She enjoys her 38-year-old child who is a high maintenance her. She has a good relationship with her partner now is doing better at her job. Finances are less of a problem. The patient's health issues are significant. The patient is moderately overweight and has a hemoglobin A1c over 8. The patient denies any chest pain or shortness of breath. She denies any psychotic symptoms. The patient denies use of alcohol or illicit drugs. Patient is very active in her therapy weekly basis. She's done well taking Wellbutrin and Celexa as ordered but shears self reduced her BuSpar from 3 down to 2. She says her anxiety is actually well controlled at this time. She's not suicidal. She is functioning very well. She is tolerating work better and better. Patient Active Problem List   Diagnosis Date Noted  . Major depressive disorder, recurrent episode, moderate (HCC) [F33.1] 06/20/2012    Class: Chronic   Total Time spent with patient: 30 minutes  Past Psychiatric History:   Past Medical History:  Past Medical History  Diagnosis Date  . Obesity   . Pre-diabetes     Past Surgical History  Procedure Laterality Date  . Spinal fusion  2004  . Back surgery  2004  . Uterine ablation   2010   Family History:  Family History  Problem Relation Age of Onset  . Heart Problems Father    Family Psychiatric  History:  Social History:  History  Alcohol Use No     History  Drug Use No    Social History   Social History  . Marital Status: Single    Spouse Name: N/A  . Number of Children: N/A  . Years of Education: N/A   Social History Main Topics  . Smoking status: Never Smoker   . Smokeless tobacco: Never Used  . Alcohol Use: No  . Drug Use: No  . Sexual Activity: Yes   Other Topics Concern  . None   Social History Narrative   Additional Social History:                         Sleep: Good  Appetite:  Good  Current Medications: Current Outpatient Prescriptions  Medication Sig Dispense Refill  . buPROPion (WELLBUTRIN XL) 300 MG 24 hr tablet Take 1 tablet (300 mg total) by mouth daily. 30 tablet 6  . buPROPion (WELLBUTRIN) 100 MG tablet Take 100 mg by mouth 2 (two) times daily.  4  . busPIRone (BUSPAR) 15 MG tablet 1  bid 90 tablet 6  . citalopram (CELEXA) 40 MG tablet Take 1 tablet (40 mg total) by mouth at bedtime. 1  qam 30 tablet 6  . metFORMIN (GLUMETZA) 500 MG (MOD) 24 hr tablet Take 1,500 mg by mouth daily with breakfast.    .  albuterol (PROVENTIL HFA;VENTOLIN HFA) 108 (90 BASE) MCG/ACT inhaler Inhale 1-2 puffs into the lungs every 6 (six) hours as needed for wheezing. (Patient not taking: Reported on 05/29/2015) 1 Inhaler 0  . metFORMIN (GLUCOPHAGE) 1000 MG tablet Take 1,500 mg by mouth daily with breakfast. Reported on 05/29/2015  4  . oxyCODONE-acetaminophen (PERCOCET/ROXICET) 5-325 MG per tablet Take 1-2 tablets by mouth every 6 (six) hours as needed for severe pain. (Patient not taking: Reported on 08/03/2014) 20 tablet 0   No current facility-administered medications for this visit.    Lab Results: No results found for this or any previous visit (from the past 48 hour(s)).  Blood Alcohol level:  Lab Results  Component Value Date    [facility] <11 06/20/2012    Physical Findings: AIMS:  , ,  ,  ,    CIWA:    COWS:     Musculoskeletal: Strength & Muscle Tone: within normal limits Gait & Station: normal Patient leans: N/A  Psychiatric Specialty Exam: ROS  Blood pressure 120/86, pulse 85, height  (1.676 m), weight 277 lb 6.4 oz (125.828 kg).Body mass index is 44.79 kg/(m^2).  General Appearance: Casual  Eye Contact::  Good  Speech:  Clear and Coherent  Volume:  Normal  Mood:  Euthymic  Affect:  Congruent  Thought Process:  Coherent  Orientation:  Full (Time, Place, and Person)  Thought Content:  WDL  Suicidal Thoughts:  No  Homicidal Thoughts:  No  Memory:  NA  Judgement:  Good  Insight:  Fair  Psychomotor Activity:  Normal  Concentration:  Good  Recall:  Good  Fund of Knowledge:Good  Language: Good  Akathisia:  No  Handed:  Right  AIMS (if indicated):     Assets:  Desire for Improvement  ADL's:  Intact  Cognition: WNL  Sleep:      Treatment Plan Summary: At this time the patient's #1 problem is that of clinical depression. She takes Celexa 40 mg and Wellbutrin 300 mg has no side effects and seems to get a distinct improvement from it. The patient herself reduced her BuSpar from 45 mg down to 30 mg. She takes 50 mg twice a day. Patient continues in psychotherapy with [name]. The patient is very active enjoys work and is functioning well. At this time she'll continue taking these medicines and return to see me in 5 month the patient is not suicidal. She denies shortness of breath or chest pain. She denies any neurological symptoms at this time.  Christie Mallow, MD 05/29/2015, 4:26 PM

## 2015-10-30 ENCOUNTER — Ambulatory Visit (INDEPENDENT_AMBULATORY_CARE_PROVIDER_SITE_OTHER): Payer: No Typology Code available for payment source | Admitting: Psychiatry

## 2015-10-30 ENCOUNTER — Encounter (HOSPITAL_COMMUNITY): Payer: Self-pay | Admitting: Psychiatry

## 2015-10-30 VITALS — BP 128/74 | HR 84 | Ht 66.5 in | Wt 270.2 lb

## 2015-10-30 DIAGNOSIS — F329 Major depressive disorder, single episode, unspecified: Secondary | ICD-10-CM

## 2015-10-30 MED ORDER — CITALOPRAM HYDROBROMIDE 40 MG PO TABS: 40.0000 mg | ORAL_TABLET | Freq: Every day | ORAL | 6 refills | Status: DC

## 2015-10-30 MED ORDER — BUPROPION HCL ER (XL) 150 MG PO TB24: ORAL_TABLET | ORAL | 3 refills | Status: DC

## 2015-10-30 NOTE — Progress Notes (Signed)
[facility]  10/30/2015 4:20 PM [name]  MRN:  [id number] Subjective: Feeling well Principal Problem: Major depression, recurrent episode remission Diagnosis:  Major depression, recurrent episode remission Today the patient shares that 2 months ago she stopped all her medications because of expense. She had pain for her daughter's medications. A month ago the patient began feeling horrible. She feels persistent daily depression. She is not suicidal. She says her sleep is afflicted her appetite is fair but her energy level is low. She has a reduced ability to enjoy herself. She is crying in the interview. She drinks no alcohol uses no drugs. She continues in psychotherapy with Jorje GuildMary Sue Allcot. The patient realizes that she's made a mistake. She'll continue in therapy. She drinks no alcohol uses no drugs but does agree to go back on her medication. Patient is still able to work but is having a hard time. Total Time spent with patient: 30 minutes  Past Psychiatric History:   Past Medical History:  Past Medical History:  Diagnosis Date  . Obesity   . Pre-diabetes     Past Surgical History:  Procedure Laterality Date  . BACK SURGERY  2004  . SPINAL FUSION  2004  . uterine ablation  2010   Family History:  Family History  Problem Relation Age of Onset  . Heart Problems Father    Family Psychiatric  History:  Social History:  History  Alcohol Use No     History  Drug Use No    Social History   Social History  . Marital status: Single    Spouse name: N/A  . Number of children: N/A  . Years of education: N/A   Social History Main Topics  . Smoking status: Never Smoker  . Smokeless tobacco: Never Used  . Alcohol use No  . Drug use: No  . Sexual activity: Yes   Other Topics Concern  . None   Social History Narrative  . None   Additional Social History:                         Sleep: Good  Appetite:  Good  Current Medications: Current  Outpatient Prescriptions  Medication Sig Dispense Refill  . albuterol (PROVENTIL HFA;VENTOLIN HFA) 108 (90 BASE) MCG/ACT inhaler Inhale 1-2 puffs into the lungs every 6 (six) hours as needed for wheezing. 1 Inhaler 0  . metFORMIN (GLUCOPHAGE) 1000 MG tablet Take 1,500 mg by mouth daily with breakfast. Reported on 05/29/2015  4  . metFORMIN (GLUMETZA) 500 MG (MOD) 24 hr tablet Take 1,500 mg by mouth daily with breakfast.    . buPROPion (WELLBUTRIN XL) 150 MG 24 hr tablet 1  qam  For 4 days then 2  qam 60 tablet 3  . buPROPion (WELLBUTRIN) 100 MG tablet Take 100 mg by mouth 2 (two) times daily.  4  . busPIRone (BUSPAR) 15 MG tablet 1  bid (Patient not taking: Reported on 10/30/2015) 90 tablet 6  . citalopram (CELEXA) 40 MG tablet Take 1 tablet (40 mg total) by mouth at bedtime. 1  qam 30 tablet 6  . oxyCODONE-acetaminophen (PERCOCET/ROXICET) 5-325 MG per tablet Take 1-2 tablets by mouth every 6 (six) hours as needed for severe pain. 20 tablet 0   No current facility-administered medications for this visit.     Lab Results: No results found for this or any previous visit (from the past 48 hour(s)).  Blood Alcohol level:  Lab Results  Component Value Date   [facility] <11 06/20/2012    Physical Findings: AIMS:  , ,  ,  ,    CIWA:    COWS:     Musculoskeletal: Strength & Muscle Tone: within normal limits Gait & Station: normal Patient leans: N/A  Psychiatric Specialty Exam: ROS  Blood pressure 128/74, pulse 84, height 5' 6.5" (1.689 m), weight 270 lb 3.2 oz (122.6 kg).Body mass index is 42.96 kg/m.  General Appearance: Casual  Eye Contact::  Good  Speech:  Clear and Coherent  Volume:  Normal  Mood:  Euthymic  Affect:  Congruent  Thought Process:  Coherent  Orientation:  Full (Time, Place, and Person)  Thought Content:  WDL  Suicidal Thoughts:  No  Homicidal Thoughts:  No  Memory:  NA  Judgement:  Good  Insight:  Fair  Psychomotor Activity:  Normal  Concentration:  Good  Recall:   Good  Fund of Knowledge:Good  Language: Good  Akathisia:  No  Handed:  Right  AIMS (if indicated):     Assets:  Desire for Improvement  ADL's:  Intact  Cognition: WNL  Sleep:      Treatment Plan Summary: At this time the patient will go back to taking Wellbutrin 150 mg every morning for 4 days and then increase it to a total dose of 300 mg. At the same time the patient will go to Celexa 20 mg for 6 days and then increase that back to the standard dose she was previously taking 40 mg. Therefore in approximately one week she'll be back up to 40 mg of Celexa and 300 mg of Wellbutrin. For the time being will hold off any BuSpar. The patient will continue in therapy. She'll continue going to work. Things change in a negative way she'll contact us. The patient return to see me in 5 weeks. This patient is not suicidal. She is no specific medical complaints.  [name], MD 10/30/2015, 4:20 PM

## 2015-12-11 ENCOUNTER — Ambulatory Visit (INDEPENDENT_AMBULATORY_CARE_PROVIDER_SITE_OTHER): Payer: No Typology Code available for payment source | Admitting: Psychiatry

## 2015-12-11 ENCOUNTER — Encounter (HOSPITAL_COMMUNITY): Payer: Self-pay | Admitting: Psychiatry

## 2015-12-11 VITALS — BP 126/80 | HR 113 | Ht 66.0 in | Wt 271.8 lb

## 2015-12-11 DIAGNOSIS — F329 Major depressive disorder, single episode, unspecified: Secondary | ICD-10-CM

## 2015-12-11 MED ORDER — BUSPIRONE HCL 15 MG PO TABS
ORAL_TABLET | ORAL | 6 refills | Status: DC
Start: 2015-12-11 — End: 2016-03-06

## 2015-12-11 NOTE — Progress Notes (Signed)
[facility] MD Progress Note  12/11/2015 3:43 PM [name]  MRN:  [id number] Subjective: Feeling well Principal Problem: Major depression, recurrent episode remission Diagnosis:  Major depression, recurrent episode remission Today the patient is doing much better. She's back on Celexa 40 mg and Wellbutrin 300 mg. She said after a week or 2 of treatment she felt almost back to herself. She's not 100% but she is nearly there. We chose not to start back BuSpar by today the patient and I discussed anxiety. It looks like we should try and go back to the full dose of BuSpar to see if it makes a difference. If it doesn't we'll talk about the possibility of using something like Neurontin. The patient seems like she was to keep away from all benzodiazepines. I really think the patient really wants to try to deal with her anxiety psychologically by cognitive approaches which makes perfect sense. She works very close with her therapist. She is hesitant to go back on the BuSpar and she'll discuss it with her therapist. The patient is actually doing well. She is sleeping and eating well and has good energy. She goes to work and performs well. Her third grade daughter is back at school and doing well. Patient drinks no alcohol uses no drugs. She continues in therapy. She's not suicidal. She denies chest pain or shortness breath or any neurological symptoms.  Past Psychiatric History:   Past Medical History:  Past Medical History:  Diagnosis Date  . Obesity   . Pre-diabetes     Past Surgical History:  Procedure Laterality Date  . BACK SURGERY  2004  . SPINAL FUSION  2004  . uterine ablation  2010   Family History:  Family History  Problem Relation Age of Onset  . Heart Problems Father    Family Psychiatric  History:  Social History:  History  Alcohol Use No     History  Drug Use No    Social History   Social History  . Marital status: Single    Spouse name: N/A  . Number of children: N/A  .  Years of education: N/A   Social History Main Topics  . Smoking status: Never Smoker  . Smokeless tobacco: Never Used  . Alcohol use No  . Drug use: No  . Sexual activity: Yes   Other Topics Concern  . None   Social History Narrative  . None   Additional Social History:                         Sleep: Good  Appetite:  Good  Current Medications: Current Outpatient Prescriptions  Medication Sig Dispense Refill  . albuterol (PROVENTIL HFA;VENTOLIN HFA) 108 (90 BASE) MCG/ACT inhaler Inhale 1-2 puffs into the lungs every 6 (six) hours as needed for wheezing. 1 Inhaler 0  . buPROPion (WELLBUTRIN XL) 150 MG 24 hr tablet 1  qam  For 4 days then 2  qam 60 tablet 3  . buPROPion (WELLBUTRIN) 100 MG tablet Take 100 mg by mouth 2 (two) times daily.  4  . busPIRone (BUSPAR) 15 MG tablet 1  Bid for 1 week then  1  qam  2qhs 90 tablet 6  . citalopram (CELEXA) 40 MG tablet Take 1 tablet (40 mg total) by mouth at bedtime. 1  qam 30 tablet 6  . metFORMIN (GLUCOPHAGE) 1000 MG tablet Take 1,500 mg by mouth daily with breakfast. Reported on 05/29/2015  4  . metFORMIN (  GLUMETZA) 500 MG (MOD) 24 hr tablet Take 1,500 mg by mouth daily with breakfast.    . oxyCODONE-acetaminophen (PERCOCET/ROXICET) 5-325 MG per tablet Take 1-2 tablets by mouth every 6 (six) hours as needed for severe pain. 20 tablet 0   No current facility-administered medications for this visit.     Lab Results: No results found for this or any previous visit (from the past 48 hour(s)).  Blood Alcohol level:  Lab Results  Component Value Date   [facility] 06/20/2012    Physical Findings: AIMS:  , ,  ,  ,    CIWA:    COWS:     Musculoskeletal: Strength & Muscle Tone: within normal limits Gait & Station: normal Patient leans: N/A  Psychiatric Specialty Exam: ROS  Blood pressure 126/80, pulse (!) 113, height 5\' 6"  (1.676 m), weight 271 lb 12.8 oz (123.3 kg).Body mass index is 43.87 kg/m.  General Appearance: Casual   Eye Contact::  Good  Speech:  Clear and Coherent  Volume:  Normal  Mood:  Euthymic  Affect:  Congruent  Thought Process:  Coherent  Orientation:  Full (Time, Place, and Person)  Thought Content:  WDL  Suicidal Thoughts:  No  Homicidal Thoughts:  No  Memory:  NA  Judgement:  Good  Insight:  Fair  Psychomotor Activity:  Normal  Concentration:  Good  Recall:  Good  Fund of Knowledge:Good  Language: Good  Akathisia:  No  Handed:  Right  AIMS (if indicated):     Assets:  Desire for Improvement  ADL's:  Intact  Cognition: WNL  Sleep:      Treatment Plan Summary: At this time the patient is doing well. She is 70-90% improved. She knows now to stick with the Celexa 40 and Wellbutrin 300. Today regarding to go back on BuSpar and make and 50 mg twice a day for one week and 15 mg 1 in the morning and 2 at night. Patient return to see me in 3 months. In the BuSpar hasn't really helped her anxiety level will consider discontinuing it and either start nothing, start Neurontin but will probably avoid benzodiazepines. The patient is doing much better now that she's back on her medicines. Lucas MallowGerald Irving Loyola Santino, MD 12/11/2015, 3:43 PM

## 2016-01-26 IMAGING — CR DG CHEST 2V
2 series · 2 of 2 positions shown · non-contrast
Comparison: November 22, 2010

CLINICAL DATA: One day history of chest pain and shortness of
breath

EXAM:
CHEST  2 VIEW

[w chest pa]
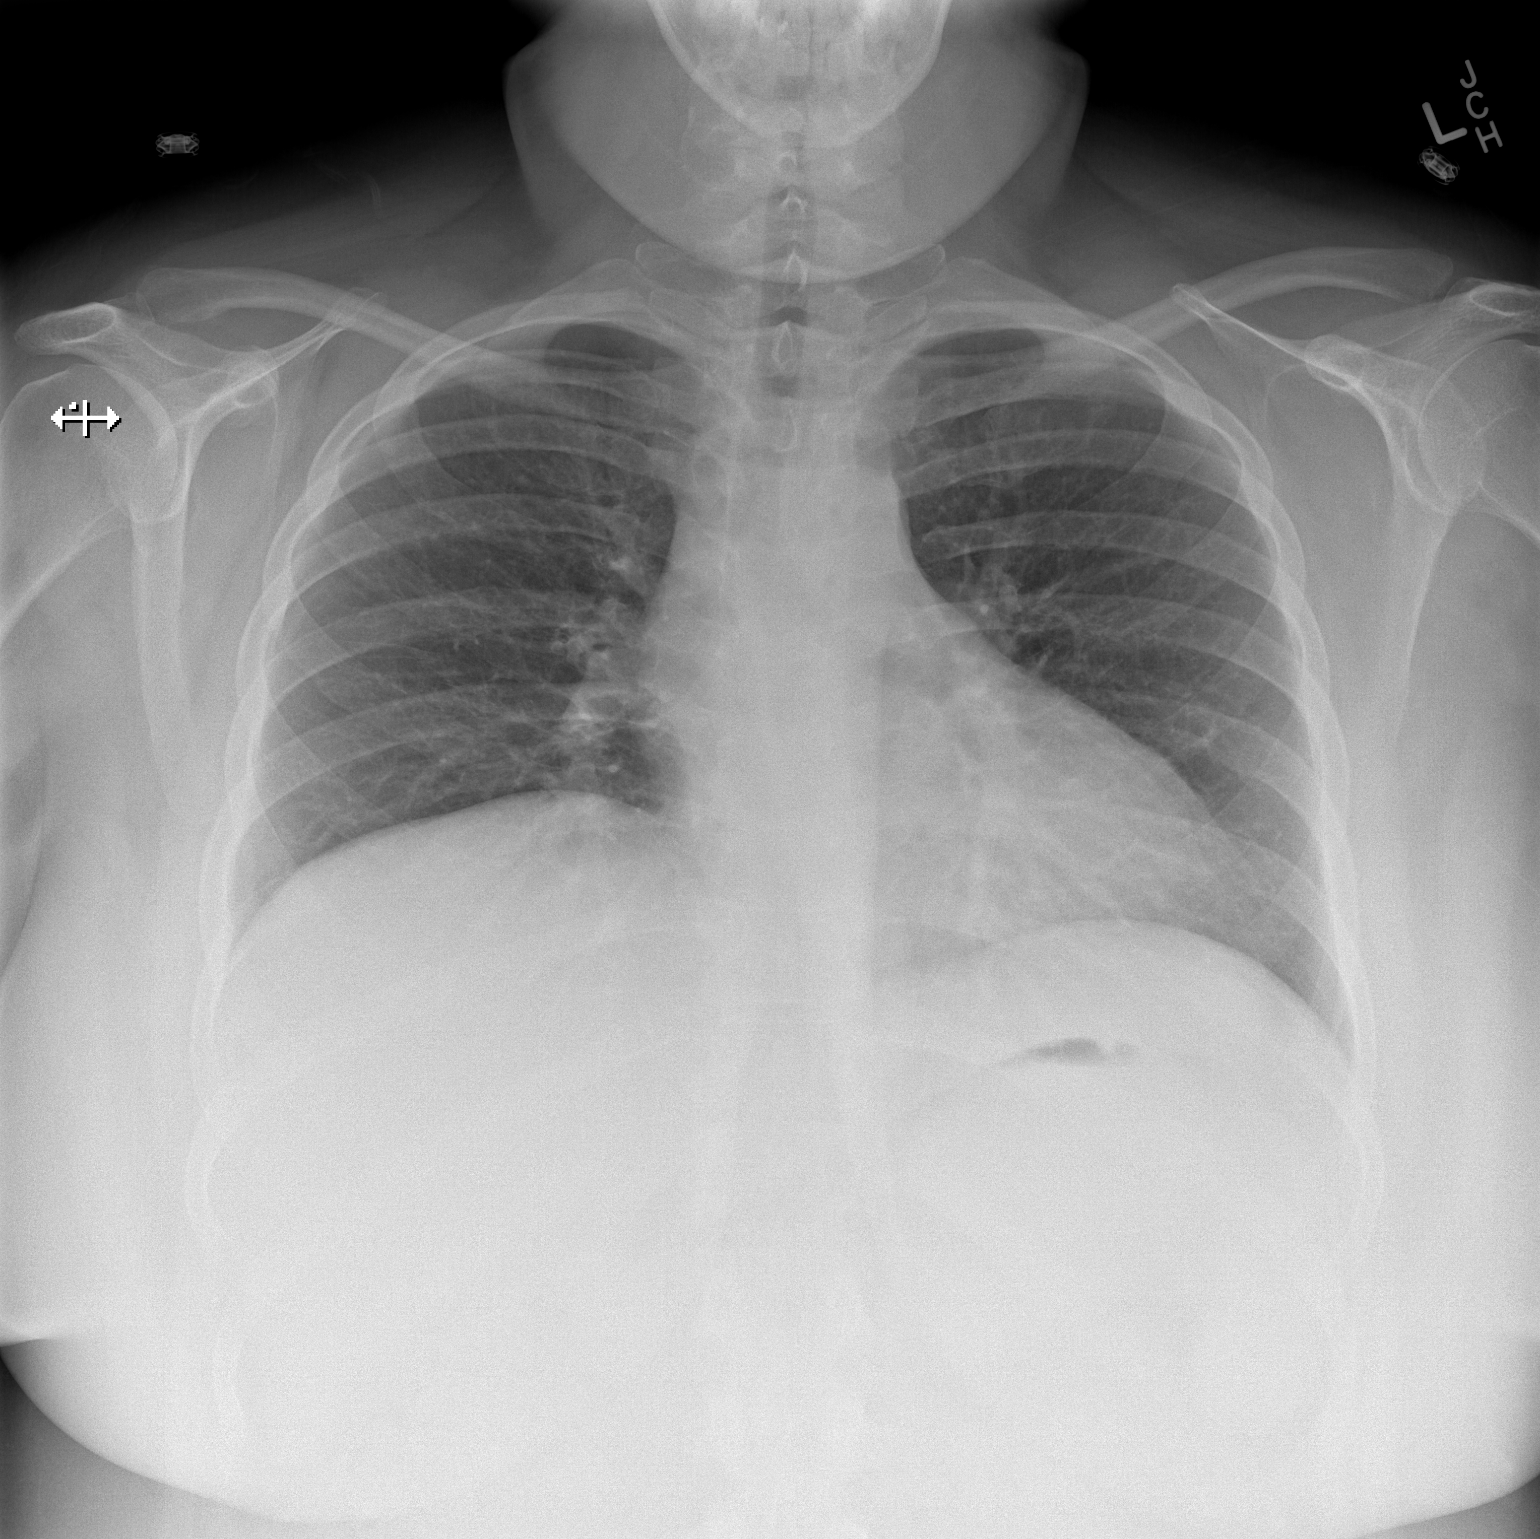

[w chest lat]
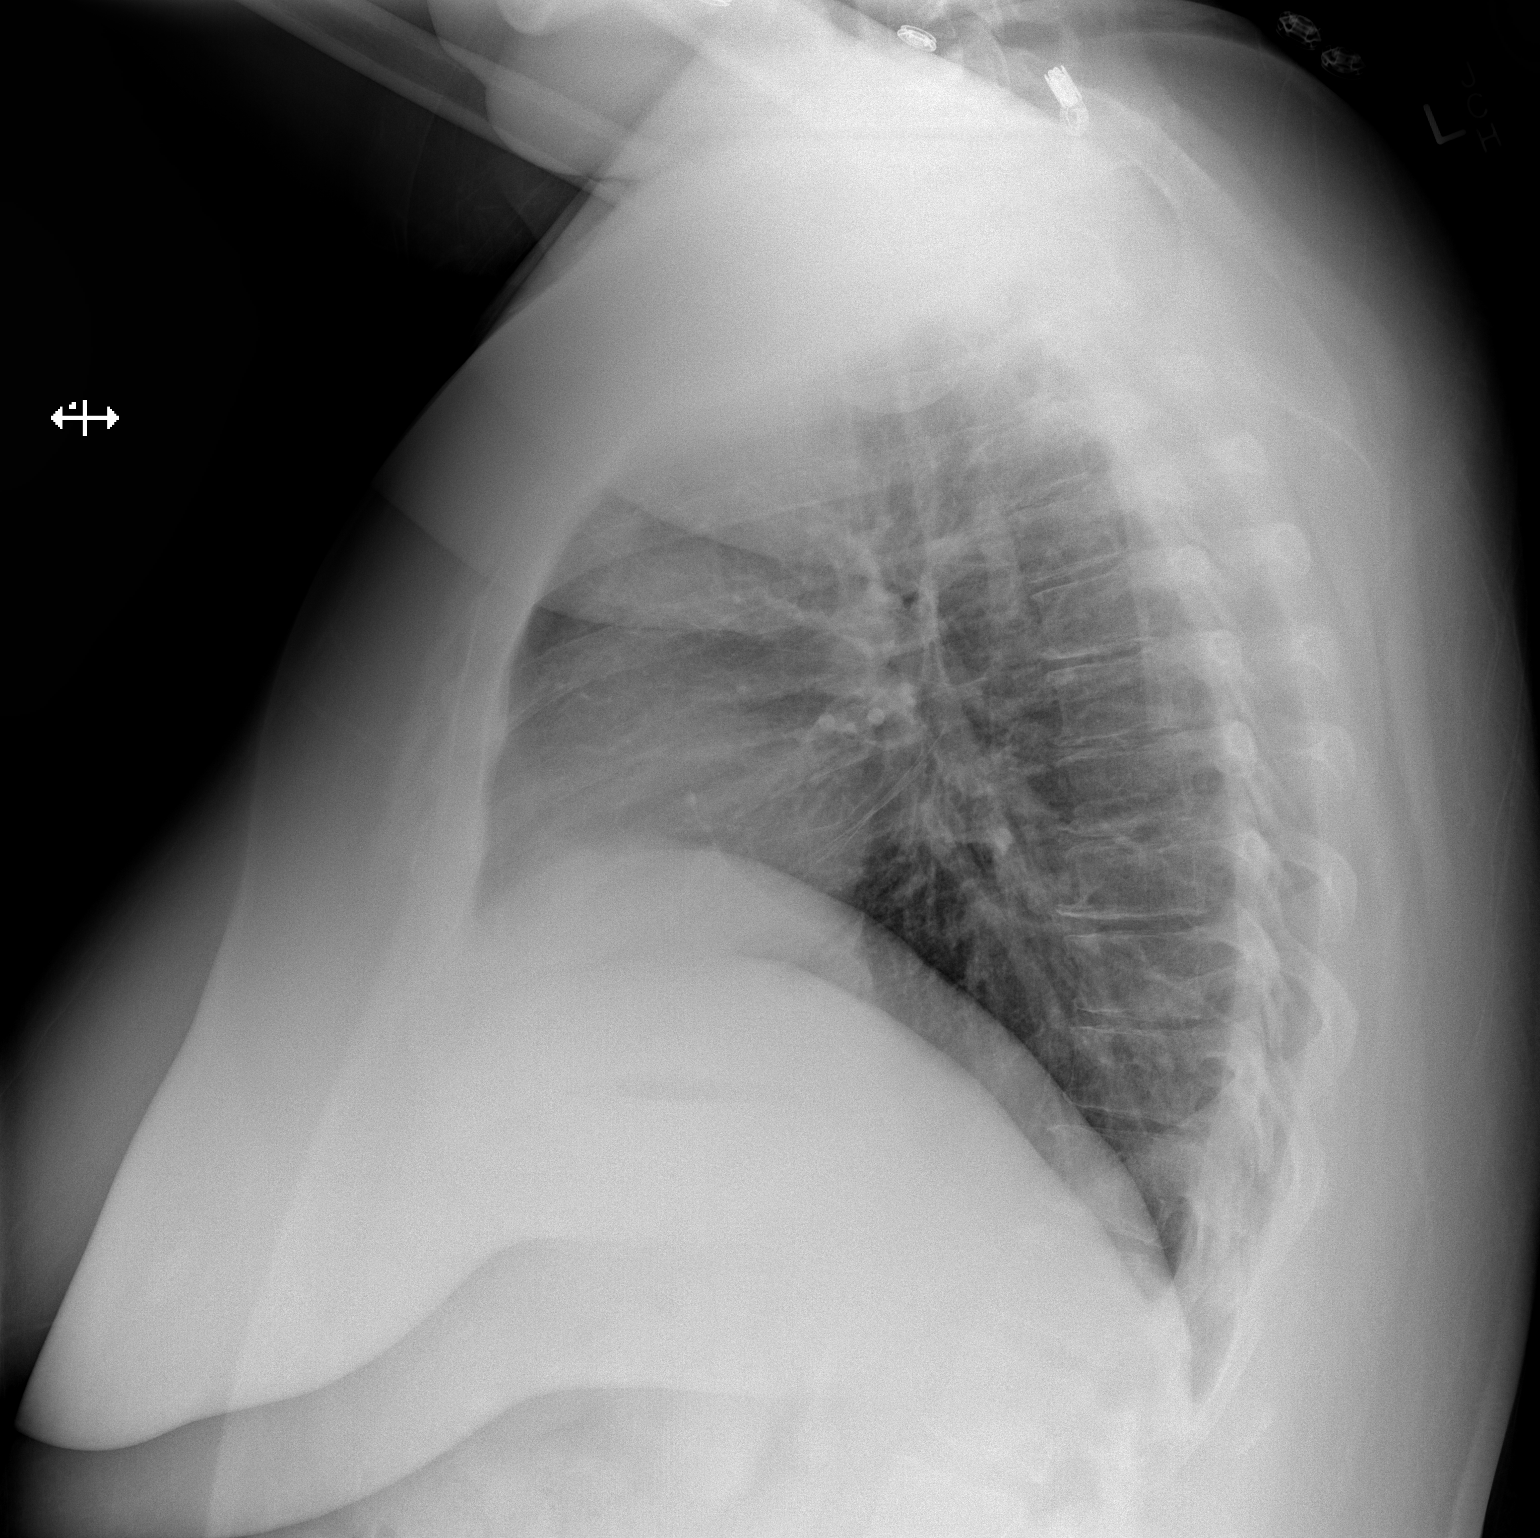

[2 of 2 positions shown; findings below may reference images not displayed]

FINDINGS: Lungs are clear. Heart size and pulmonary vascularity are normal. No
adenopathy. No bone lesions.
IMPRESSION: No edema or consolidation.

## 2016-02-25 ENCOUNTER — Telehealth (HOSPITAL_COMMUNITY): Payer: Self-pay

## 2016-02-25 ENCOUNTER — Other Ambulatory Visit (HOSPITAL_COMMUNITY): Payer: Self-pay

## 2016-02-25 MED ORDER — CLONAZEPAM 1 MG PO TABS: 1.0000 mg | ORAL_TABLET | Freq: Every evening | ORAL | 0 refills | Status: DC | PRN

## 2016-02-25 NOTE — Telephone Encounter (Signed)
Patient is having increased depression, racing thoughts and suicidal ideation. She is also having insomnia. 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 Patient states that when she lays down at night to go to sleep, she can not shut off her brain. She is having racing thoughts and she believes this is contributing to her depression. I told patient I would send this message, but I will call you as well.

## 2016-02-25 NOTE — Telephone Encounter (Signed)
I spoke to Dr. Donell BeersPlovsky and he asked me to call in Klonopin 1 mg po qhs. This was done and I called patient to let her know

## 2016-02-26 ENCOUNTER — Ambulatory Visit (HOSPITAL_COMMUNITY): Payer: Self-pay | Admitting: Psychiatry

## 2016-03-03 ENCOUNTER — Encounter (HOSPITAL_COMMUNITY): Payer: Self-pay | Admitting: Emergency Medicine

## 2016-03-03 ENCOUNTER — Inpatient Hospital Stay (HOSPITAL_COMMUNITY)
Admission: AD | Admit: 2016-03-03 | Discharge: 2016-03-06 | DRG: 885 | Disposition: A | Discharge status: Home / Self Care | Payer: No Typology Code available for payment source | Source: Intra-hospital | Attending: Psychiatry | Admitting: Psychiatry

## 2016-03-03 ENCOUNTER — Encounter (HOSPITAL_COMMUNITY): Payer: Self-pay

## 2016-03-03 ENCOUNTER — Emergency Department (HOSPITAL_COMMUNITY)
Admission: EM | Admit: 2016-03-03 | Discharge: 2016-03-03 | Disposition: A | Discharge status: Other Acute Inpatient Hospital | Payer: No Typology Code available for payment source | Attending: Emergency Medicine | Admitting: Emergency Medicine

## 2016-03-03 DIAGNOSIS — F419 Anxiety disorder, unspecified: Secondary | ICD-10-CM | POA: Diagnosis present

## 2016-03-03 DIAGNOSIS — F332 Major depressive disorder, recurrent severe without psychotic features: Principal | ICD-10-CM | POA: Diagnosis present

## 2016-03-03 DIAGNOSIS — E669 Obesity, unspecified: Secondary | ICD-10-CM | POA: Diagnosis present

## 2016-03-03 DIAGNOSIS — Z915 Personal history of self-harm: Secondary | ICD-10-CM | POA: Diagnosis not present

## 2016-03-03 DIAGNOSIS — Z881 Allergy status to other antibiotic agents status: Secondary | ICD-10-CM | POA: Diagnosis not present

## 2016-03-03 DIAGNOSIS — R45851 Suicidal ideations: Secondary | ICD-10-CM

## 2016-03-03 DIAGNOSIS — Z981 Arthrodesis status: Secondary | ICD-10-CM

## 2016-03-03 DIAGNOSIS — G4700 Insomnia, unspecified: Secondary | ICD-10-CM | POA: Diagnosis present

## 2016-03-03 DIAGNOSIS — Z79899 Other long term (current) drug therapy: Secondary | ICD-10-CM

## 2016-03-03 DIAGNOSIS — Z91030 Bee allergy status: Secondary | ICD-10-CM | POA: Diagnosis not present

## 2016-03-03 DIAGNOSIS — E119 Type 2 diabetes mellitus without complications: Secondary | ICD-10-CM | POA: Diagnosis present

## 2016-03-03 DIAGNOSIS — Z7984 Long term (current) use of oral hypoglycemic drugs: Secondary | ICD-10-CM

## 2016-03-03 DIAGNOSIS — F4310 Post-traumatic stress disorder, unspecified: Secondary | ICD-10-CM | POA: Diagnosis present

## 2016-03-03 DIAGNOSIS — Z8249 Family history of ischemic heart disease and other diseases of the circulatory system: Secondary | ICD-10-CM | POA: Diagnosis not present

## 2016-03-03 DIAGNOSIS — R451 Restlessness and agitation: Secondary | ICD-10-CM | POA: Diagnosis present

## 2016-03-03 DIAGNOSIS — Z6841 Body Mass Index (BMI) 40.0 and over, adult: Secondary | ICD-10-CM | POA: Diagnosis not present

## 2016-03-03 HISTORY — DX: Type 2 diabetes mellitus without complications: E11.9

## 2016-03-03 LAB — CBC
HCT: 42.8 % (ref 36.0–46.0)
Hemoglobin: 14.5 g/dL (ref 12.0–15.0)
MCH: 29.4 pg (ref 26.0–34.0)
MCHC: 33.9 g/dL (ref 30.0–36.0)
MCV: 86.8 fL (ref 78.0–100.0)
Platelets: 374 10*3/uL (ref 150–400)
RBC: 4.93 MIL/uL (ref 3.87–5.11)
RDW: 12.3 % (ref 11.5–15.5)
WBC: 10 10*3/uL (ref 4.0–10.5)

## 2016-03-03 LAB — ACETAMINOPHEN LEVEL

## 2016-03-03 LAB — COMPREHENSIVE METABOLIC PANEL
ALK PHOS: 62 U/L (ref 38–126)
ALT: 23 U/L (ref 14–54)
ANION GAP: 8 (ref 5–15)
AST: 17 U/L (ref 15–41)
Albumin: 4.3 g/dL (ref 3.5–5.0)
BUN: 10 mg/dL (ref 6–20)
CALCIUM: 9.1 mg/dL (ref 8.9–10.3)
CO2: 26 mmol/L (ref 22–32)
CREATININE: 0.78 mg/dL (ref 0.44–1.00)
Chloride: 102 mmol/L (ref 101–111)
GFR calc Af Amer: >60 mL/min (ref >60)
Glucose, Bld: 186 mg/dL — ABNORMAL HIGH (ref 65–99)
Potassium: 4 mmol/L (ref 3.5–5.1)
SODIUM: 136 mmol/L (ref 135–145)
TOTAL PROTEIN: 7.8 g/dL (ref 6.5–8.1)
Total Bilirubin: 0.8 mg/dL (ref 0.3–1.2)

## 2016-03-03 LAB — RAPID URINE DRUG SCREEN, HOSP PERFORMED
Amphetamines: NOT DETECTED
Barbiturates: NOT DETECTED
Benzodiazepines: NOT DETECTED
COCAINE: NOT DETECTED
OPIATES: NOT DETECTED
Tetrahydrocannabinol: NOT DETECTED

## 2016-03-03 LAB — PREGNANCY, URINE: Preg Test, Ur: NEGATIVE

## 2016-03-03 LAB — ETHANOL

## 2016-03-03 LAB — SALICYLATE LEVEL

## 2016-03-03 MED ORDER — CITALOPRAM HYDROBROMIDE 40 MG PO TABS
40.0000 mg | ORAL_TABLET | Freq: Every day | ORAL | Status: DC
  Administered 2016-03-04 – 2016-03-06 (×3): 40 mg via ORAL
  Filled 2016-03-03: qty 1
  Filled 2016-03-03: qty 2
  Filled 2016-03-03 (×4): qty 1

## 2016-03-03 MED ORDER — TRAZODONE HCL 50 MG PO TABS
50.0000 mg | ORAL_TABLET | Freq: Every evening | ORAL | Status: DC | PRN
  Administered 2016-03-03 – 2016-03-04 (×2): 50 mg via ORAL
  Filled 2016-03-03 (×10): qty 1

## 2016-03-03 MED ORDER — CLONAZEPAM 1 MG PO TABS
1.0000 mg | ORAL_TABLET | Freq: Every day | ORAL | Status: DC
  Administered 2016-03-03: 1 mg via ORAL
  Filled 2016-03-03: qty 1

## 2016-03-03 MED ORDER — CITALOPRAM HYDROBROMIDE 10 MG PO TABS: 40.0000 mg | ORAL_TABLET | Freq: Every day | ORAL | Status: DC

## 2016-03-03 MED ORDER — ALUM & MAG HYDROXIDE-SIMETH 200-200-20 MG/5ML PO SUSP: 30.0000 mL | ORAL | Status: DC | PRN

## 2016-03-03 MED ORDER — METFORMIN HCL 500 MG PO TABS
1000.0000 mg | ORAL_TABLET | Freq: Two times a day (BID) | ORAL | Status: DC
  Administered 2016-03-03: 1000 mg via ORAL
  Filled 2016-03-03 (×2): qty 2

## 2016-03-03 MED ORDER — METFORMIN HCL 500 MG PO TABS
1000.0000 mg | ORAL_TABLET | Freq: Two times a day (BID) | ORAL | Status: DC
  Administered 2016-03-04 – 2016-03-06 (×5): 1000 mg via ORAL
  Filled 2016-03-03 (×10): qty 2

## 2016-03-03 MED ORDER — BUPROPION HCL ER (XL) 150 MG PO TB24
150.0000 mg | ORAL_TABLET | Freq: Every day | ORAL | Status: DC
  Filled 2016-03-03: qty 1

## 2016-03-03 MED ORDER — ACETAMINOPHEN 325 MG PO TABS: 650.0000 mg | ORAL_TABLET | ORAL | Status: DC | PRN

## 2016-03-03 MED ORDER — ONDANSETRON HCL 4 MG PO TABS: 4.0000 mg | ORAL_TABLET | Freq: Three times a day (TID) | ORAL | Status: DC | PRN

## 2016-03-03 MED ORDER — MAGNESIUM HYDROXIDE 400 MG/5ML PO SUSP: 30.0000 mL | Freq: Every day | ORAL | Status: DC | PRN

## 2016-03-03 MED ORDER — ACETAMINOPHEN 325 MG PO TABS: 650.0000 mg | ORAL_TABLET | Freq: Four times a day (QID) | ORAL | Status: DC | PRN

## 2016-03-03 MED ORDER — BUPROPION HCL ER (XL) 150 MG PO TB24
150.0000 mg | ORAL_TABLET | Freq: Every day | ORAL | Status: DC
  Administered 2016-03-04 – 2016-03-06 (×3): 150 mg via ORAL
  Filled 2016-03-03 (×7): qty 1

## 2016-03-03 MED ORDER — ALBUTEROL SULFATE HFA 108 (90 BASE) MCG/ACT IN AERS: 1.0000 | INHALATION_SPRAY | Freq: Four times a day (QID) | RESPIRATORY_TRACT | Status: DC | PRN

## 2016-03-03 MED ORDER — HYDROXYZINE HCL 25 MG PO TABS: 25.0000 mg | ORAL_TABLET | Freq: Four times a day (QID) | ORAL | Status: DC | PRN

## 2016-03-03 MED ORDER — CLONAZEPAM 1 MG PO TABS: 1.0000 mg | ORAL_TABLET | Freq: Every evening | ORAL | Status: DC | PRN

## 2016-03-03 MED ORDER — ZOLPIDEM TARTRATE 5 MG PO TABS: 5.0000 mg | ORAL_TABLET | Freq: Every evening | ORAL | Status: DC | PRN

## 2016-03-03 MED ORDER — IBUPROFEN 200 MG PO TABS: 600.0000 mg | ORAL_TABLET | Freq: Three times a day (TID) | ORAL | Status: DC | PRN

## 2016-03-03 NOTE — ED Notes (Addendum)
[facility]  Per Marquita PalmsAkeysha, TTS, Dr. Jama Flavorsobos.  Dr. Rhunette CroftNanavati informed.

## 2016-03-03 NOTE — Progress Notes (Signed)
Patient ID: Christie BenderAmber L Amster, female   DOB: 01-Dec-1975, 40 y.o.   MRN: [id number] Received report for admitting nurse Wynn BankerHeather R. RN. D: Client presents with sad affect, after admission. A: Writer introduced self to client, reviewed medications, administered as ordered. Encouraged questions, concerns. Staff will monitor q7215min for safety. R: Client is safe on the unit.

## 2016-03-03 NOTE — ED Notes (Signed)
Pt stated "I recently got started on a new medicine.  I told my wife, it seems it gets worse around 10 in the morning and that's when the suicidal thoughts start coming.Marland Kitchen.  He started klonopin because I was having trouble sleeping.  He started it about a week ago."

## 2016-03-03 NOTE — ED Notes (Signed)
Patient has been wanded as well as clothes

## 2016-03-03 NOTE — Tx Team (Signed)
Initial Treatment Plan 03/03/2016 10:08 PM [name] BadL Crotty [id number]    PATIENT STRESSORS: Financial difficulties Medication change or noncompliance Other: estranged from her family   PATIENT STRENGTHS: Capable of independent living Wellsite geologistCommunication skills General fund of knowledge Motivation for treatment/growth Religious Affiliation Work skills   PATIENT IDENTIFIED PROBLEMS: Depression  Anxiety  Suicidal ideation  "I don't want to feel as bad"  "Learn some coping skills for anxiety"             DISCHARGE CRITERIA:  Improved stabilization in mood, thinking, and/or behavior Verbal commitment to aftercare and medication compliance  PRELIMINARY DISCHARGE PLAN: Outpatient therapy Medication management  PATIENT/FAMILY INVOLVEMENT: This treatment plan has been presented to and reviewed with the patient, [name].  The patient and family have been given the opportunity to ask questions and make suggestions.  Levin BaconHeather V Hanah Moultry, RN 03/03/2016, 10:08 PM
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
## 2016-03-03 NOTE — ED Triage Notes (Signed)
Patient is a resident at home.  She has thoughts of harming herself but no plan.  Patient has taken her  prescription medications today.  Denies any bruises or cuts to her body.  Denies any ingestion of alcohol in the past week.

## 2016-03-03 NOTE — Progress Notes (Signed)
[name] is a 40 year old female being admitted voluntarily to [facility] from [facility].  She presented to the ED with suicidal thoughts, depression and anxiety.  She did state that she has a plan but would not [facility].  She reported financial and family stressors.  She has history of multiple suicide attempts in the past.  She reported hopelessness, fatigue, anhedonia and irritability.  She denies HI or A/V hallucinations.  She denies alcohol and drug use.  She is diagnosed with MDD, Anxiety and PTSD.  She continues to report feeling depressed.  She denies current suicidal ideation and is able to contract for safety on the unit.  She c/o headache 2/10.  She reported that she has diabetes and it is controlled by metformin.  She reported recent medication changes and is wondering if the addition of klonopin has made her symptoms worse.  Encouraged her to talk with her doctor tomorrow.  Oriented her to the unit.  Admission paperwork completed and signed.  Belongings searched and secured in locker # 11.  Skin assessment completed and noted lower back scar from fusion surgery.  Q 15 minute checks initiated for safety.  We will monitor the progress towards her goals.

## 2016-03-03 NOTE — ED Notes (Signed)
Awaiting [facility] return for transport of pt.  Pt informed awaiting transport team.  Pt verbalized understanding.

## 2016-03-03 NOTE — ED Notes (Signed)
Provided support at bedside.     Encountered pt's Brewing technologistcommunity minister in ED waiting.  Minister could not be at bedside due to ED lock down.  Requested chaplain provide support in ED.    Zyon was seated on bed.  Anxious affect.  Worried about overhead announcement for ED lock down.  Concerned for safety of spouse, Rene KocherRegina, and minister, Raynelle FanningJulie.    Provided empathic presence, pastoral support, calming prayers and breathing exercises.    Francyne stated "I just want to go home"  Identified her primary reason for not wishing to [facility] is daughter, Jana Halfmory, has Systems developerschool-wide Spelling Bee tonight.  [name] is has been nervous about bee and Bentleigh does not want her hospitalization to affect [name].   Chaplain presented a couple options for staying connected with [name] around spelling been including calling her from ED as well as possibly facetime / skype if she was able to have a visitor.    Chaplain worked with security to clear Sun Microsystemscommunity minister to be present with pt.     Belva CromeStalnaker, Thierry Dobosz Wayne MDiv

## 2016-03-03 NOTE — ED Provider Notes (Signed)
[facility]ENCY DEPT Provider Note   CSN: [id number] Arrival date & time: 03/03/16  1217     History   Chief Complaint Chief Complaint  Patient presents with  . Suicidal  . Medical Clearance    HPI [name] is a 40 y.o. female.  [name] is a 40 y.o. female with h/o MDD, diabetes, obesity, and neurocardiogenic syncope presents to ED with complaint of suicidal ideation onset today. Patient states she has been under increasing stress lately secondary to financial concerns, she also found out today that she did not get a job she was hoping to get. She reports suicidal ideation. When asked about a plan she states "I don't want to talk about it." She is being treated for depression and speaks with her therapist daily. Reports she was hospitalized approximately 4 years ago for depression. She denies self harm, HI, V/A hallucinations. Denies any medical concerns at this time.       Past Medical History:  Diagnosis Date  . Diabetes mellitus without complication (HCC)   . Obesity   . Pre-diabetes     Patient Active Problem List   Diagnosis Date Noted  . Major depressive disorder, recurrent episode, moderate (HCC) 06/20/2012    Class: Chronic    Past Surgical History:  Procedure Laterality Date  . BACK SURGERY  2004  . SPINAL FUSION  2004  . uterine ablation  2010    OB History    No data available       Home Medications    Prior to Admission medications   Medication Sig Start Date End Date Taking? Authorizing Provider  albuterol (PROVENTIL HFA;VENTOLIN HFA) 108 (90 BASE) MCG/ACT inhaler Inhale 1-2 puffs into the lungs every 6 (six) hours as needed for wheezing. 08/29/12  Yes Adlih Moreno-Coll, MD  buPROPion (WELLBUTRIN XL) 150 MG 24 hr tablet 1  qam  For 4 days then 2  qam Patient taking differently: Take 150 mg by mouth daily.  10/30/15  Yes Archer AsaGerald Plovsky, MD  citalopram (CELEXA) 40 MG tablet Take 1 tablet (40 mg total) by mouth at bedtime. 1   qam Patient taking differently: Take 40 mg by mouth daily.  10/30/15  Yes Archer AsaGerald Plovsky, MD  clonazePAM (KLONOPIN) 1 MG tablet Take 1 tablet (1 mg total) by mouth at bedtime as needed for anxiety. 02/25/16 02/24/17 Yes Archer AsaGerald Plovsky, MD  metFORMIN (GLUCOPHAGE) 1000 MG tablet Take 1,000 mg by mouth 2 (two) times daily with a meal. Reported on 05/29/2015 06/29/14  Yes Historical Provider, MD  busPIRone (BUSPAR) 15 MG tablet 1  Bid for 1 week then  1  qam  2qhs Patient not taking: Reported on 03/03/2016 12/11/15   Archer AsaGerald Plovsky, MD    Family History Family History  Problem Relation Age of Onset  . Heart Problems Father     Social History Social History  Substance Use Topics  . Smoking status: Never Smoker  . Smokeless tobacco: Never Used  . Alcohol use No     Allergies   Other and Bee venom   Review of Systems Review of Systems  Psychiatric/Behavioral: Positive for suicidal ideas. The patient is nervous/anxious.   All other systems reviewed and are negative.    Physical Exam Updated Vital Signs BP 109/75 (BP Location: Left Arm)   Pulse 72   Temp 98.2 F (36.8 C) (Oral)   Resp 18   Ht 5' 6.5" (1.689 m)   Wt 120.2 kg   SpO2 99%   BMI  42.13 kg/m   Physical Exam  Constitutional: She appears well-developed and well-nourished. No distress.  HENT:  Head: Normocephalic and atraumatic.  Mouth/Throat: Oropharynx is clear and moist. No oropharyngeal exudate.  Eyes: Conjunctivae and EOM are normal. Right eye exhibits no discharge. Left eye exhibits no discharge. No scleral icterus.  Neck: Normal range of motion and phonation normal. Neck supple. No neck rigidity. Normal range of motion present.  Cardiovascular: Normal rate, regular rhythm, normal heart sounds and intact distal pulses.   No murmur heard. Pulmonary/Chest: Effort normal and breath sounds normal. No stridor. No respiratory distress. She has no wheezes. She has no rales.  Abdominal: Soft. Bowel sounds are normal. She  exhibits no distension. There is no tenderness.  Musculoskeletal: Normal range of motion.  Neurological: She is alert. She is not disoriented. Coordination and gait normal. GCS eye subscore is 4. GCS verbal subscore is 5. GCS motor subscore is 6.  Skin: Skin is warm and dry. She is not diaphoretic.  Psychiatric: She is withdrawn. She exhibits a depressed mood.  Patient is tearful on assessment.      ED Treatments / Results  Labs (all labs ordered are listed, but only abnormal results are displayed) Labs Reviewed  COMPREHENSIVE METABOLIC PANEL - Abnormal; Notable for the following:       Result Value   Glucose, Bld 186 (*)    All other components within normal limits  ACETAMINOPHEN LEVEL - Abnormal; Notable for the following:    Acetaminophen (Tylenol), Serum <10 (*)    All other components within normal limits  ETHANOL  SALICYLATE LEVEL  CBC  RAPID URINE DRUG SCREEN, HOSP PERFORMED  I-STAT BETA HCG BLOOD, ED ([facility], [facility], [facility] ONLY)    EKG  EKG Interpretation None       Radiology No results found.  Procedures Procedures (including critical care time)  Medications Ordered in ED Medications  alum & mag hydroxide-simeth (MAALOX/MYLANTA) 200-200-20 MG/5ML suspension 30 mL (not administered)  ondansetron (ZOFRAN) tablet 4 mg (not administered)  zolpidem (AMBIEN) tablet 5 mg (not administered)  ibuprofen (ADVIL,MOTRIN) tablet 600 mg (not administered)  acetaminophen (TYLENOL) tablet 650 mg (not administered)  albuterol (PROVENTIL HFA;VENTOLIN HFA) 108 (90 Base) MCG/ACT inhaler 1-2 puff (not administered)  citalopram (CELEXA) tablet 40 mg (not administered)  metFORMIN (GLUCOPHAGE) tablet 1,000 mg (not administered)  buPROPion (WELLBUTRIN XL) 24 hr tablet 150 mg (not administered)     Initial Impression / Assessment and Plan / ED Course  I have reviewed the triage vital signs and the nursing notes.  Pertinent labs & imaging results that were available during my care of the  patient were reviewed by me and considered in my medical decision making (see chart for details).  Clinical Course     Patient presents to ED with complaint of suicidal ideation. No HI, V/A hallucinations. No medical complaints. Patient is afebrile and non-toxic appearing in NAD. VSS. Pt is tearful on assessment. Heart RRR. Lungs CTABL. Abdomen soft, non-tender, non-distended. Mild hyperglycemia, no AG. Labs otherwise re-assuring. Pt medically cleared. dispo pending TTS consult.   Patient meets inpatient criteria.   Final Clinical Impressions(s) / ED Diagnoses   Final diagnoses:  Suicidal ideation    New Prescriptions New Prescriptions   No medications on file     Lona Kettleshley Laurel Meyer, PA-C 03/03/16 1636    Azalia BilisKevin Campos, MD 03/04/16 [phone]

## 2016-03-04 DIAGNOSIS — F332 Major depressive disorder, recurrent severe without psychotic features: Principal | ICD-10-CM

## 2016-03-04 DIAGNOSIS — Z91030 Bee allergy status: Secondary | ICD-10-CM

## 2016-03-04 DIAGNOSIS — Z79899 Other long term (current) drug therapy: Secondary | ICD-10-CM

## 2016-03-04 DIAGNOSIS — Z8249 Family history of ischemic heart disease and other diseases of the circulatory system: Secondary | ICD-10-CM

## 2016-03-04 LAB — GLUCOSE, CAPILLARY: GLUCOSE-CAPILLARY: 156 mg/dL — AB (ref 65–99)

## 2016-03-04 LAB — TSH: TSH: 1.164 u[IU]/mL (ref 0.350–4.500)

## 2016-03-04 MED ORDER — LORAZEPAM 0.5 MG PO TABS
0.5000 mg | ORAL_TABLET | Freq: Four times a day (QID) | ORAL | Status: DC | PRN
  Administered 2016-03-05: 0.5 mg via ORAL
  Filled 2016-03-04: qty 1

## 2016-03-04 NOTE — Tx Team (Signed)
Interdisciplinary Treatment and Diagnostic Plan Update  03/04/2016 Time of Session: 5:11 PM  [name] MRN: [id number]  Principal Diagnosis: Major Depression, Recurrent , Severe, no Psychotic Features  Secondary Diagnoses: Active Problems:   MDD (major depressive disorder), recurrent episode, severe (HCC)   Current Medications:  Current Facility-Administered Medications  Medication Dose Route Frequency Provider Last Rate Last Dose  . acetaminophen (TYLENOL) tablet 650 mg  650 mg Oral Q6H PRN Laverle Hobby, PA-C      . albuterol (PROVENTIL HFA;VENTOLIN HFA) 108 (90 Base) MCG/ACT inhaler 1-2 puff  1-2 puff Inhalation Q6H PRN Laverle Hobby, PA-C      . alum & mag hydroxide-simeth (MAALOX/MYLANTA) 200-200-20 MG/5ML suspension 30 mL  30 mL Oral Q4H PRN Laverle Hobby, PA-C      . buPROPion (WELLBUTRIN XL) 24 hr tablet 150 mg  150 mg Oral Daily Laverle Hobby, PA-C   150 mg at 03/04/16 0754  . citalopram (CELEXA) tablet 40 mg  40 mg Oral Daily Laverle Hobby, PA-C   40 mg at 03/04/16 0754  . LORazepam (ATIVAN) tablet 0.5 mg  0.5 mg Oral Q6H PRN Jenne Campus, MD      . magnesium hydroxide (MILK OF MAGNESIA) suspension 30 mL  30 mL Oral Daily PRN Laverle Hobby, PA-C      . metFORMIN (GLUCOPHAGE) tablet 1,000 mg  1,000 mg Oral BID WC Laverle Hobby, PA-C   1,000 mg at 03/04/16 0753  . traZODone (DESYREL) tablet 50 mg  50 mg Oral QHS,MR X 1 Laverle Hobby, PA-C   50 mg at 03/03/16 2334    PTA Medications: Prescriptions Prior to Admission  Medication Sig Dispense Refill Last Dose  . albuterol (PROVENTIL HFA;VENTOLIN HFA) 108 (90 BASE) MCG/ACT inhaler Inhale 1-2 puffs into the lungs every 6 (six) hours as needed for wheezing. 1 Inhaler 0 UNKNOWN  . buPROPion (WELLBUTRIN XL) 150 MG 24 hr tablet 1  qam  For 4 days then 2  qam (Patient taking differently: Take 150 mg by mouth daily. ) 60 tablet 3 03/03/2016 at Unknown time  . citalopram (CELEXA) 40 MG tablet Take 1 tablet (40  mg total) by mouth at bedtime. 1  qam (Patient taking differently: Take 40 mg by mouth daily. ) 30 tablet 6 03/03/2016 at Unknown time  . clonazePAM (KLONOPIN) 1 MG tablet Take 1 tablet (1 mg total) by mouth at bedtime as needed for anxiety. 30 tablet 0 03/02/2016 at Unknown time  . metFORMIN (GLUCOPHAGE) 1000 MG tablet Take 1,000 mg by mouth 2 (two) times daily with a meal. Reported on 05/29/2015  4 03/03/2016 at Unknown time  . busPIRone (BUSPAR) 15 MG tablet 1  Bid for 1 week then  1  qam  2qhs (Patient not taking: Reported on 03/04/2016) 90 tablet 6 Not Taking at Unknown time    Treatment Modalities: Medication Management, Group therapy, Case management,  1 to 1 session with clinician, Psychoeducation, Recreational therapy.  Patient Stressors: Financial difficulties Medication change or noncompliance Other: estranged from her family  Patient Strengths: Capable of independent living Curator fund of knowledge Motivation for treatment/growth Religious Affiliation Work Artist for Primary Diagnosis: Major Depression, Recurrent , Severe, no Psychotic Features Long Term Goal(s): Improvement in symptoms so as ready for discharge  Short Term Goals: Ability to verbalize feelings will improve Ability to disclose and discuss suicidal ideas Ability to demonstrate self-control will improve Ability to identify and develop  effective coping behaviors will improve Ability to maintain clinical measurements within normal limits will improve Ability to verbalize feelings will improve Ability to disclose and discuss suicidal ideas Ability to demonstrate self-control will improve Ability to identify and develop effective coping behaviors will improve Ability to maintain clinical measurements within normal limits will improve  Medication Management: Evaluate patient's response, side effects, and tolerance of medication regimen.  Therapeutic Interventions:  1 to 1 sessions, Unit Group sessions and Medication administration.  Evaluation of Outcomes: Not Met  Physician Treatment Plan for Secondary Diagnosis: Active Problems:   MDD (major depressive disorder), recurrent episode, severe ([facility])   Long Term Goal(s): Improvement in symptoms so as ready for discharge  Short Term Goals: Ability to verbalize feelings will improve Ability to disclose and discuss suicidal ideas Ability to demonstrate self-control will improve Ability to identify and develop effective coping behaviors will improve Ability to maintain clinical measurements within normal limits will improve Ability to verbalize feelings will improve Ability to disclose and discuss suicidal ideas Ability to demonstrate self-control will improve Ability to identify and develop effective coping behaviors will improve Ability to maintain clinical measurements within normal limits will improve  Medication Management: Evaluate patient's response, side effects, and tolerance of medication regimen.  Therapeutic Interventions: 1 to 1 sessions, Unit Group sessions and Medication administration.  Evaluation of Outcomes: Not Met   RN Treatment Plan for Primary Diagnosis: Major Depression, Recurrent , Severe, no Psychotic Features Long Term Goal(s): Knowledge of disease and therapeutic regimen to maintain health will improve  Short Term Goals: Ability to verbalize feelings will improve, Ability to disclose and discuss suicidal ideas and Ability to identify and develop effective coping behaviors will improve  Medication Management: RN will administer medications as ordered by provider, will assess and evaluate patient's response and provide education to patient for prescribed medication. RN will report any adverse and/or side effects to prescribing provider.  Therapeutic Interventions: 1 on 1 counseling sessions, Psychoeducation, Medication administration, Evaluate responses to treatment, Monitor  vital signs and CBGs as ordered, Perform/monitor CIWA, COWS, AIMS and Fall Risk screenings as ordered, Perform wound care treatments as ordered.  Evaluation of Outcomes: Not Met   LCSW Treatment Plan for Primary Diagnosis: Major Depression, Recurrent , Severe, no Psychotic Features Long Term Goal(s): Safe transition to appropriate next level of care at discharge, Engage patient in therapeutic group addressing interpersonal concerns.  Short Term Goals: Engage patient in aftercare planning with referrals and resources and Increase skills for wellness and recovery  Therapeutic Interventions: Assess for all discharge needs, 1 to 1 time with Social worker, Explore available resources and support systems, Assess for adequacy in community support network, Educate family and significant other(s) on suicide prevention, Complete Psychosocial Assessment, Interpersonal group therapy.  Evaluation of Outcomes: Not Met   Progress in Treatment: Attending groups: Pt is new to milieu, continuing to assess  Participating in groups: Pt is new to milieu, continuing to assess  Taking medication as prescribed: Yes, MD continues to assess for medication changes as needed Toleration medication: Yes, no side effects reported at this time Family/Significant other contact made: Yes with wife Patient understands diagnosis: Continuing to assess Discussing patient identified problems/goals with staff: Yes Medical problems stabilized or resolved: Yes Denies suicidal/homicidal ideation: No, recently admitted with passive SI Issues/concerns per patient self-inventory: None Other: N/A  New problem(s) identified: None identified at this time.   New Short Term/Long Term Goal(s): None identified at this time.   Discharge Plan or Barriers: Pt will  return home and follow-up with outpatient services.   Reason for Continuation of Hospitalization: Anxiety Depression Medication stabilization Suicidal ideation  Estimated  Length of Stay: 3-5 days  Attendees: Patient: 03/04/2016  5:11 PM  Physician: Dr. Parke Poisson 03/04/2016  5:11 PM  Nursing: Lawerance Sabal, RN 03/04/2016  5:11 PM  RN Care Manager: Lars Pinks, RN 03/04/2016  5:11 PM  Social Worker: Adriana Reams, LCSW; Erasmo Downer Drinkard, LCSW 03/04/2016  5:11 PM  Recreational Therapist:  03/04/2016  5:11 PM  Other: Lindell Spar, NP; Samuel Jester, NP 03/04/2016  5:11 PM  Other:  03/04/2016  5:11 PM  Other: 03/04/2016  5:11 PM    Scribe for Treatment Team: Gladstone Lighter, LCSW 03/04/2016 5:11 PM

## 2016-03-04 NOTE — [facility] Group Notes (Signed)
[facility] LCSW Group Therapy 03/04/2016 1:15 PM  Type of Therapy: Group Therapy- Emotion Regulation  Participation Level: Minimal  Participation Quality:  Attentive  Affect: Flat  Cognitive: Alert and Oriented   Insight:  Unable to assess  Engagement in Therapy: Minimal  Modes of Intervention: Clarification, Confrontation, Discussion, Education, Exploration, Limit-setting, Orientation, Problem-solving, Rapport Building, Dance movement psychotherapisteality Testing, Socialization and Support  Summary of Progress/Problems: The topic for group today was emotional regulation. This group focused on both positive and negative emotion identification and allowed group members to process ways to identify feelings, regulate negative emotions, and find healthy ways to manage internal/external emotions. Group members were asked to reflect on a time when their reaction to an emotion led to a negative outcome and explored how alternative responses using emotion regulation would have benefited them. Group members were also asked to discuss a time when emotion regulation was utilized when a negative emotion was experienced. Pt did not participate in group discussion but did remain attentive throughout.   Christie ShanksLauren Elzada Pytel, LCSW 03/04/2016 5:15 PM

## 2016-03-04 NOTE — Progress Notes (Signed)
Recreation Therapy Notes  Date: 03/04/16 Time: 0930 Location: [address] Dayroom  Group Topic: Stress Management  Goal Area(s) Addresses:  Patient will verbalize importance of using healthy stress management.  Patient will identify positive emotions associated with healthy stress management.   Intervention: Calm App  Activity :  Letting Go Meditation.  LRT introduced the stress management technique of meditation.  LRT played the meditation to allow patients to fully engage in the meditation.  Patients were to follow along as meditation played.   Education:  Stress Management, Discharge Planning.   Education Outcome: Acknowledges edcuation/In group clarification offered/Needs additional education  Clinical Observations/Feedback: Pt did not attend group.   Leshaun Biebel, LRT/CTRS         Delonte Musich A 03/04/2016 12:38 PM 
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
## 2016-03-04 NOTE — Progress Notes (Signed)
DAR NOTE: Patient presents with flat, sad affect and depressed mood.  Denies pain, auditory and visual hallucinations.  Described energy level as low and concentration as poor.  Rates depression at 7, hopelessness at 4, and anxiety at 8.  Maintained on routine safety checks.  Medications given as prescribed.  Support and encouragement offered as needed.    States goal for today is "getting to the point I can go home."  Patient is withdrawn with period of crying spell.  Offered no complaint.
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
## 2016-03-04 NOTE — BHH Suicide Risk Assessment (Signed)
[facility] INPATIENT:  Family/Significant Other Suicide Prevention Education  Suicide Prevention Education:  Education Completed; Angus PalmsRegina Patrone, spouse, [phone], has been identified by the patient as the family member/significant other with whom the patient will be residing, and identified as the person(s) who will aid the patient in the event of a mental health crisis (suicidal ideations/suicide attempt).  With written consent from the patient, the family member/significant other has been provided the following suicide prevention education, prior to the and/or following the discharge of the patient.  The suicide prevention education provided includes the following:  Suicide risk factors  Suicide prevention and interventions  National Suicide Hotline telephone number  [facility] assessment telephone number  [facility] Emergency Assistance 911  [facility] and/or Residential Mobile Crisis Unit telephone number  Request made of family/significant other to:  Remove weapons (e.g., guns, rifles, knives), all items previously/currently identified as safety concern.    Remove drugs/medications (over-the-counter, prescriptions, illicit drugs), all items previously/currently identified as a safety concern.  The family member/significant other verbalizes understanding of the suicide prevention education information provided.  The family member/significant other agrees to remove the items of safety concern listed above.  Spoke with spouse, who also reports no guns in the home.  Spouse reports some concern due to pt's increased anger of late, reporting she get angry very easily and that there has been a lot of yelling, which spouse worries is affecting their daughter.  She also reports pt is overly concerned regarding finances, which are always somewhat tight but not at any sort of crisis level currently.  Spouse also reported client continues to have significant issues with  sleep.  Klonopin was helpful briefly but this didn't last and pt was again getting very little sleep.  Lorri FrederickWierda, Caelen Reierson Jon 03/04/2016, 3:37 PM

## 2016-03-04 NOTE — Plan of Care (Signed)
Problem: Safety: Goal: Periods of time without injury will increase Outcome: Progressing Client is safe on the unit AEB q5315min safety checks, no indication of self harm.

## 2016-03-05 LAB — HEMOGLOBIN A1C
Hgb A1c MFr Bld: 9 % — ABNORMAL HIGH (ref 4.8–5.6)
MEAN PLASMA GLUCOSE: 212 mg/dL

## 2016-03-05 LAB — GLUCOSE, CAPILLARY: Glucose-Capillary: 185 mg/dL — ABNORMAL HIGH (ref 65–99)

## 2016-03-05 LAB — PROLACTIN: Prolactin: 28.8 ng/mL — ABNORMAL HIGH (ref 4.8–23.3)

## 2016-03-05 MED ORDER — GABAPENTIN 100 MG PO CAPS
100.0000 mg | ORAL_CAPSULE | Freq: Three times a day (TID) | ORAL | Status: DC
  Administered 2016-03-05 – 2016-03-06 (×4): 100 mg via ORAL
  Filled 2016-03-05 (×9): qty 1

## 2016-03-05 NOTE — Plan of Care (Signed)
Problem: Activity: Goal: Interest or engagement in activities will improve Outcome: Progressing Patient interested in learning more about her medication regimen. Information requested and was provided by Clinical research associatewriter.

## 2016-03-05 NOTE — BHH Group Notes (Signed)

## 2016-03-05 NOTE — B[facility] Group Notes (Signed)
Patient did not attend group.

## 2016-03-05 NOTE — Progress Notes (Signed)
Nutrition Education Note  Pt attended group focusing on general, healthful nutrition education.  RD emphasized the importance of eating regular meals and snacks throughout the day. Consuming sugar-free beverages and incorporating fruits and vegetables into diet when possible. Provided examples of healthy snacks. Patient encouraged to leave group with a goal to improve nutrition/healthy eating.   Diet Order: Diet Carb Modified Fluid consistency:: Thin; Room service appropriate?: Yes Pt is also offered choice of unit snacks mid-morning and mid-afternoon.  Pt is eating as desired.   If additional nutrition issues arise, please consult RD.  Christie Propp, MS, RD, LDN Pager: [phone] After Hours Pager: [phone]    

## 2016-03-05 NOTE — Progress Notes (Signed)
Nursing Progress Note 7p-7a  D) Patient presents anxious but assertive with Clinical research associatewriter. Patient reports having a "an okay" day. Patient asked for a print out on trazodone to learn more about the medication. Patient denies SI/HI/AVH or pain. Patient contracts for safety at this time.   A) Emotional support given. Medication information provided as requested. Patient on q15 min safety checks. Opportunities for questions or concerns presented to patient.   R) Patient receptive to interaction with nurse. Patient remains safe on the unit at this time. Patient is resting in bed without complaints. Will continue to monitor.

## 2016-03-05 NOTE — Progress Notes (Signed)
Medication information sheet given to patient regarding medication lorazepam per patient request. Information included uses, side affects and administration.

## 2016-03-05 NOTE — Progress Notes (Signed)
[facility] MD Progress Note  03/05/2016 2:34 PM [name]  MRN:  [id number] Subjective:  Patient reports she is feeling partially better today, but still reporting depression, anxiety. She states that anxiety, in particular, remains severe, significant, and states she feels apprehensive and worries often. At this time does not endorse medication side effects. Objective : I have discussed case with treatment team and have met with patient. She reports partially improved mood and states she feels better today, although still not close to her baseline. States she feels anxious today as she is expecting her Spouse /daughter to visit, but is looking forward to seeing them.  Less ruminative about recent stressors, which include financial stressors and frustration about not getting a job she had applied for. She does continue to describe significant subjective sense of anxiety. Today denies suicidal ideations, contracts for safety on unit. Visible on unit, no disruptive or agitated behaviors .   Principal Problem: <principal problem not specified> Diagnosis:   Patient Active Problem List   Diagnosis Date Noted  . MDD (major depressive disorder), recurrent episode, severe ([place]) [F33.2] 03/03/2016  . Major depressive disorder, recurrent episode, moderate (HCC) [F33.1] 06/20/2012    Class: Chronic   Total Time spent with patient: 20 minutes   Past Medical History:  Past Medical History:  Diagnosis Date  . Diabetes mellitus without complication ([place])   . Obesity   . Pre-diabetes     Past Surgical History:  Procedure Laterality Date  . BACK SURGERY  2004  . SPINAL FUSION  2004  . uterine ablation  2010   Family History:  Family History  Problem Relation Age of Onset  . Heart Problems Father    Social History:  History  Alcohol Use No     History  Drug Use No    Social History   Social History  . Marital status: Single    Spouse name: N/A  . Number of children: N/A  . Years  of education: N/A   Social History Main Topics  . Smoking status: Never Smoker  . Smokeless tobacco: Never Used  . Alcohol use No  . Drug use: No  . Sexual activity: Yes   Other Topics Concern  . None   Social History Narrative  . None   Additional Social History:    Pain Medications: Denies Prescriptions: Denies Over the Counter: Denies History of alcohol / drug use?: No history of alcohol / drug abuse  Sleep: Good  Appetite:  Good  Current Medications: Current Facility-Administered Medications  Medication Dose Route Frequency Provider Last Rate Last Dose  . acetaminophen (TYLENOL) tablet 650 mg  650 mg Oral Q6H PRN Laverle Hobby, PA-C      . albuterol (PROVENTIL HFA;VENTOLIN HFA) 108 (90 Base) MCG/ACT inhaler 1-2 puff  1-2 puff Inhalation Q6H PRN Laverle Hobby, PA-C      . alum & mag hydroxide-simeth (MAALOX/MYLANTA) 200-200-20 MG/5ML suspension 30 mL  30 mL Oral Q4H PRN Laverle Hobby, PA-C      . buPROPion (WELLBUTRIN XL) 24 hr tablet 150 mg  150 mg Oral Daily Laverle Hobby, PA-C   150 mg at 03/05/16 0086  . citalopram (CELEXA) tablet 40 mg  40 mg Oral Daily Laverle Hobby, PA-C   40 mg at 03/05/16 7619  . gabapentin (NEURONTIN) capsule 100 mg  100 mg Oral TID Jenne Campus, MD      . LORazepam (ATIVAN) tablet 0.5 mg  0.5 mg Oral Q6H PRN  Jenne Campus, MD   0.5 mg at 03/05/16 5053  . magnesium hydroxide (MILK OF MAGNESIA) suspension 30 mL  30 mL Oral Daily PRN Laverle Hobby, PA-C      . metFORMIN (GLUCOPHAGE) tablet 1,000 mg  1,000 mg Oral BID WC Laverle Hobby, PA-C   1,000 mg at 03/05/16 0814  . traZODone (DESYREL) tablet 50 mg  50 mg Oral QHS,MR X 1 Laverle Hobby, PA-C   50 mg at 03/04/16 2107    Lab Results:  Results for orders placed or performed during the hospital encounter of 03/03/16 (from the past 48 hour(s))  Glucose, capillary     Status: Abnormal   Collection Time: 03/04/16  6:13 AM  Result Value Ref Range   Glucose-Capillary 156 (H)  65 - 99 mg/dL  Hemoglobin A1c     Status: Abnormal   Collection Time: 03/04/16  6:14 AM  Result Value Ref Range   Hgb A1c MFr Bld 9.0 (H) 4.8 - 5.6 %    Comment: (NOTE)         Pre-diabetes: 5.7 - 6.4         Diabetes: >6.4         Glycemic control for adults with diabetes: <7.0    Mean Plasma Glucose 212 mg/dL    Comment: (NOTE) Performed At: [facility] [place], [place] [id number] Lindon Romp MD [id number] Performed at [facility]   TSH     Status: None   Collection Time: 03/04/16  6:14 AM  Result Value Ref Range   TSH 1.164 0.350 - 4.500 uIU/mL    Comment: Performed by a 3rd Generation assay with a functional sensitivity of <=0.01 uIU/mL. Performed at [facility]   Prolactin     Status: Abnormal   Collection Time: 03/04/16  6:14 AM  Result Value Ref Range   Prolactin 28.8 (H) 4.8 - 23.3 ng/mL    Comment: (NOTE) Performed At: [facility] [place], [place] [id number] Lindon Romp MD [id number] Performed at [facility]   Glucose, capillary     Status: Abnormal   Collection Time: 03/05/16  6:50 AM  Result Value Ref Range   Glucose-Capillary 185 (H) 65 - 99 mg/dL    Blood Alcohol level:  Lab Results  Component Value Date   ETH <5 03/03/2016   ETH <11 11/94/1740    Metabolic Disorder Labs: Lab Results  Component Value Date   HGBA1C 9.0 (H) 03/04/2016   MPG 212 03/04/2016   MPG 174 (H) 06/20/2012   Lab Results  Component Value Date   PROLACTIN 28.8 (H) 03/04/2016   Lab Results  Component Value Date   CHOL 164 06/21/2012   TRIG 84 06/21/2012   HDL 39 (L) 06/21/2012   CHOLHDL 4.2 06/21/2012   VLDL 17 06/21/2012   LDLCALC 108 (H) 06/21/2012    Physical Findings: AIMS: Facial and Oral Movements Muscles of Facial Expression: None, normal Lips and Perioral Area: None, normal Jaw: None, normal Tongue: None, normal,Extremity  Movements Upper (arms, wrists, hands, fingers): None, normal Lower (legs, knees, ankles, toes): None, normal, Trunk Movements Neck, shoulders, hips: None, normal, Overall Severity Severity of abnormal movements (highest score from questions above): None, normal Incapacitation due to abnormal movements: None, normal Patient's awareness of abnormal movements (rate only patient's report): No Awareness, Dental Status Current problems with teeth and/or dentures?: No Does patient usually wear dentures?: No  CIWA:  COWS:     Musculoskeletal: Strength & Muscle Tone: within normal limits Gait & Station: normal Patient leans: N/A  Psychiatric Specialty Exam: Physical Exam  ROS no nausea, no vomiting   Blood pressure 100/74, pulse (!) 119, temperature 98 F (36.7 C), temperature source Oral, resp. rate 18, height 5' 5.5" (1.664 m), weight 261 lb (118.4 kg).Body mass index is 42.77 kg/m.  General Appearance: Fairly Groomed  Eye Contact:  Good  Speech:  Normal Rate  Volume:  Normal  Mood:  partially improved, but still depressed  Affect:  Appropriate and more reactive, anxious  Thought Process:  Linear  Orientation:  Full (Time, Place, and Person)  Thought Content:  no hallucinations, no delusions, not internally preoccupied  Suicidal Thoughts:  No at this time denies any active SI or self injurious ideations and contracts for safety on unit   Homicidal Thoughts:  No at this time denies any homicidal or violent ideations  Memory:  recent and remote grossly intact   Judgement:  Other:  improving   Insight:  improving   Psychomotor Activity:  Normal  Concentration:  Concentration: Good and Attention Span: Good  Recall:  Good  Fund of Knowledge:  Good  Language:  Good  Akathisia:  Negative  Handed:  Right  AIMS (if indicated):     Assets:  Desire for Improvement Resilience  ADL's:  Intact  Cognition:  WNL  Sleep:  Number of Hours: 6.75   Assessment - patient reports partial  improvement compared to admission , less severely depressed, but remains anxious,vaguely apprehensive. Denies suicidal ideations at this time. No medication side effects reported . We discussed medication options- she is aware Wellbutrin XL could have anxiogenic side effects, but has been on it for a long period of time and feels it does help her depression.  She does feel Celexa helps depression, anxiety. Agrees to Neurontin trial to address anxiety further, particularly as she wants to avoid long term BZD use, to minimize dependence potential-we discussed side effects, off label use for anxiety.  Treatment Plan Summary: Daily contact with patient to assess and evaluate symptoms and progress in treatment, Medication management, Plan inpatient treatment  and medications as below Encourage group, milieu participation to work on coping skills and symptom reduction Continue Wellbutrin XL 150 mgrs QAM for depression Continue Celexa 40 mgrs QDAY for depression and anxiety Continue Ativan 0.5 mgrs Q 6 hours PRN for anxiety as needed  Continue Trazodone 50 mgrs QHS PRN for insomnia Start Neurontin 100 mgrs TID for anxiety- side effects reviewed Continue Glucophage for management of DM Treatment team working on disposition planning options. Neita Garnet, MD 03/05/2016, 2:34 PM

## 2016-03-05 NOTE — Progress Notes (Signed)
Patient denies SI, HI and AVH this shift.  Patient reports increased anxiety and is worried about if she can see her children at visitation.  Patient was reassured that she would be able to visit with her children, but continued to worry. Patient attended groups and was compliant with medications.    Assess patient for safety, offer medications as prescribed, engage patient in 1:1 staff talks.    Patient was able to contract for safety.

## 2016-03-05 NOTE — Plan of Care (Signed)
Problem: Education: Goal: Knowledge of the prescribed therapeutic regimen will improve Outcome: Progressing Patient received information regarding prescribed medications yesterday. Patient verbalized understanding and reported having a better understanding of her medications.

## 2016-03-05 NOTE — [facility] Group Notes (Signed)
[facility] 03/05/2016 1:15pm  Type of Therapy: Mental Health Association Presentation  Participation Level: Active  Participation Quality: Attentive  Affect: Appropriate  Cognitive: Oriented  Insight: Developing/Improving  Engagement in Therapy: Engaged  Modes of Intervention: Discussion, Education and Socialization  Summary of Progress/Problems: Mental Health Association (MHA) Speaker came to talk about his personal journey with substance abuse and addiction. The pt processed ways by which to relate to the speaker. MHA speaker provided handouts and educational information pertaining to groups and services offered by the [facility]. Pt was engaged in speaker's presentation and was receptive to resources provided.    Vernie ShanksLauren Talani Brazee, LCSW 03/05/2016 1:17 PM

## 2016-03-05 NOTE — Progress Notes (Signed)
Provided continued support with pt on 400 unit.  [facility] and [facility].    Gaby reports feeling decrease of anxiety and is hopeful in seeing familiar faces of pastor today as well as visitation with daughter Etheleen Mayhew([name],9) and spouse Rene Kocher([place]).  Related some worry around visitation and stated that spouse accompanied [name] to her therapist where they shared that Joice Loftsmber is in hospital.  [name] wished to visit and Joice Loftsmber is hopeful the visit feels supportive.      Belva CromeStalnaker, Odilon Cass Wayne MDiv

## 2016-03-05 NOTE — Progress Notes (Addendum)
Medication information sheet given to patient regarding medication trazodone per patient request. Information included uses, side affects and administration.

## 2016-03-05 NOTE — Progress Notes (Signed)
Nursing Progress Note 7p-7a  D) Patient presents with flat affect and is mildly anxious. Patient is cooperative with the nurse. Patient reports having a good day. Patient denies SI/HI/AVH or pain. Patient contracts for safety at this time. Patient requests to not take trazodone before bed tonight.  A) Emotional support given. Patient on q15 min safety checks. Opportunities for questions or concerns presented to patient. Patient encouraged to continue to identify a new goal for tomorrow. (or prepare for discharge).  R) (intervention effectiveness). Patient (receptive or not receptive) to interaction with nurse. Patient remains safe on the unit at this time. Patient is resting in bed without complaints. Will continue to monitor.

## 2016-03-05 NOTE — [facility] Group Notes (Signed)
Patient attend group. Her day was 7 progress to 8. She talk to the doctor to change her med's so far the med's are working now she's ready for discharge.

## 2016-03-06 LAB — GLUCOSE, CAPILLARY: GLUCOSE-CAPILLARY: 156 mg/dL — AB (ref 65–99)

## 2016-03-06 MED ORDER — TRAZODONE HCL 50 MG PO TABS
50.0000 mg | ORAL_TABLET | Freq: Every evening | ORAL | 0 refills | Status: DC | PRN
Start: 2016-03-06 — End: 2016-04-14

## 2016-03-06 MED ORDER — GABAPENTIN 100 MG PO CAPS
100.0000 mg | ORAL_CAPSULE | Freq: Three times a day (TID) | ORAL | 0 refills | Status: DC
Start: 2016-03-06 — End: 2016-03-11

## 2016-03-06 MED ORDER — CITALOPRAM HYDROBROMIDE 40 MG PO TABS: 40.0000 mg | ORAL_TABLET | Freq: Every day | ORAL | 0 refills | Status: DC

## 2016-03-06 MED ORDER — METFORMIN HCL 1000 MG PO TABS
1000.0000 mg | ORAL_TABLET | Freq: Two times a day (BID) | ORAL | 0 refills | Status: DC
Start: 2016-03-06 — End: 2017-12-30

## 2016-03-06 MED ORDER — BUPROPION HCL ER (XL) 150 MG PO TB24: 150.0000 mg | ORAL_TABLET | Freq: Every day | ORAL | 0 refills | Status: DC

## 2016-03-06 NOTE — Progress Notes (Signed)
Recreation Therapy Notes  Date: 03/06/16 Time: 0930 Location: 300 Hall Dayroom  Group Topic: Stress Management  Goal Area(s) Addresses:  Patient will verbalize importance of using healthy stress management.  Patient will identify positive emotions associated with healthy stress management.   Behavioral Response: Engaged  Intervention: Stress Management  Activity :  Progressive muscle relaxation.  LRT introduced the stress management technique of progressive muscle relaxation.  LRT read a script which allowed patients to engage in the activity by tensing up each muscle group individually and then relaxing.  Patients were to follow along as LRT read script to fully participate in the technique.  Education:  Stress Management, Discharge Planning.   Education Outcome: Acknowledges edcuation/In group clarification offered/Needs additional education  Clinical Observations/Feedback: Pt attended group.   Christie RancherMarjette Jonique Johnson, LRT/CTRS         Lillia AbedLindsay, Ruthell Feigenbaum A 03/06/2016 11:04 AM

## 2016-03-06 NOTE — Progress Notes (Signed)
  [facility] :  Will you be returning to the same living situation after discharge:  Yes,  Pt returning home with family At discharge, do you have transportation home?: Yes,  Pt wife to pick up Do you have the ability to pay for your medications: Yes,  Pt provided with prescriptions  Release of information consent forms completed and in the chart;  Patient's signature needed at discharge.  Patient to Follow up at: Follow-up Information    [facility] Follow up on 03/11/2016.   Specialty:  Behavioral Health Why:  at 3:45pm with Dr. Donell BeersPlovsky for medication management. Contact information: [address] [place] [id number] [phone]       Jorje GuildMary Sue Olcott,LCSW Follow up on 03/12/2016.   Why:  at 5:00pm for therapy.  Contact information: [address]  [place] [id number] [phone]           Next level of care provider [facility] Link:no  Safety Planning and Suicide Prevention discussed: Yes,  with wife; see SPE note  Have you used any form of tobacco in the last 30 days? (Cigarettes, Smokeless Tobacco, Cigars, and/or Pipes): No  Has patient been referred to the Quitline?: N/A patient is not a smoker  Patient has been referred for addiction treatment: N/A  Verdene LennertLauren C Johnnye Sandford 03/06/2016, 12:35 PM

## 2016-03-06 NOTE — [facility] Suicide Risk Assessment (Signed)
[facility]   Principal Problem: MDD (major depressive disorder), recurrent episode, severe (HCC) Discharge Diagnoses:  Patient Active Problem List   Diagnosis Date Noted  . MDD (major depressive disorder), recurrent episode, severe (HCC) [F33.2] 03/03/2016  . Major depressive disorder, recurrent episode, moderate (HCC) [F33.1] 06/20/2012    Class: Chronic    Total Time spent with patient: 30 minutes  Musculoskeletal: Strength & Muscle Tone: within normal limits Gait & Station: normal Patient leans: N/A  Psychiatric Specialty Exam: ROS  Blood pressure 101/77, pulse 97, temperature 98.2 F (36.8 C), temperature source Oral, resp. rate 18, height 5' 5.5" (1.664 m), weight 261 lb (118.4 kg).Body mass index is 42.77 kg/m.  General Appearance: improved grooming  Eye Contact::  Good  Speech:  Normal Rate409  Volume:  Normal  Mood:  improved mood , denies feeling depressed at this time  Affect:  Appropriate and reactive  Thought Process:  Linear  Orientation:  Full (Time, Place, and Person)  Thought Content:  no hallucinations, no delusions, not internally preoccupied   Suicidal Thoughts:  No- denies any suicidal or self injurious ideations, denies any homicidal or violent ideations  Homicidal Thoughts:  No  Memory:  recent and remote grossly intact   Judgement:  Other:  improving  Insight:  improving   Psychomotor Activity:  Normal  Concentration:  Good  Recall:  Good  Fund of Knowledge:Good  Language: Good  Akathisia:  Negative  Handed:  Right  AIMS (if indicated):     Assets:  Desire for Improvement Resilience  Sleep:  Number of Hours: 6.75  Cognition: WNL  ADL's:  Intact   Mental Status Per Nursing Assessment::   On Admission:  NA  Demographic Factors:  40 year old married female, has one child  Loss Factors: Financial stressor, work related stressors, did not get job she had applied to and was hoping to get  Historical  Factors: History of depression, history of anxiety,history of prior suicide attempt approxiomately 6 years ago  Risk Reduction Factors:   Responsible for children under 38 years of age, Sense of responsibility to family, Employed, Living with another person, especially a relative and Positive social support  Continued Clinical Symptoms:  At this time patient is alert, attentive, well related, mood improved , denies feeling depressed at this time, affect more reactive, brighter, no thought disorder, no suicidal or self injurious ideations, no homicidal or violent ideations, no hallucinations, no delusions, future oriented. Behavior on unit calm, in good control Denies medication side effects at this time  Cognitive Features That Contribute To Risk:  No gross cognitive deficits noted upon discharge. Is alert , attentive, and oriented x 3   Suicide Risk:  Mild:  Suicidal ideation of limited frequency, intensity, duration, and specificity.  There are no identifiable plans, no associated intent, mild dysphoria and related symptoms, good self-control (both objective and subjective assessment), few other risk factors, and identifiable protective factors, including available and accessible social support.  Follow-up Information    [facility] Follow up on 03/11/2016.   Specialty:  Behavioral Health Why:  at 3:45pm with Dr. Donell BeersPlovsky for medication management. Contact information: [address] [place] [phone]       Jorje GuildMary Sue Olcott,LCSW Follow up on 03/12/2016.   Why:  at 5:00pm for therapy.  Contact information: [address]  [place] [phone]  Plan Of Care/Follow-up recommendations:  Activity:  as tolerated  Diet:  Diabetic Diet  Tests:  NA Other:  See below Patient is requesting discharge, and there are no current grounds for involuntary commitment  She is  leaving unit in good spirits  Plans to return home Follow up as above Will follow up with her PCP for ongoing medical /diabetic care as needed  Nehemiah MassedOBOS, Reginia Battie, MD 03/06/2016, 1:23 PM

## 2016-03-06 NOTE — Progress Notes (Signed)
Discharge note:  Patient discharged home per MD order.  Patient received all personal belongings from unit and locker.  Patient denies any thoughts of self harm.  Reviewed AVS/transition record with patient and she indicated understanding.  Patient will follow up with her outpatient provider.  Patient left ambulatory with her significant other.

## 2016-03-06 NOTE — Tx Team (Signed)
Interdisciplinary Treatment and Diagnostic Plan Update  03/06/2016 Time of Session: 12:34 PM  [name] MRN: [id number]  Principal Diagnosis: Major Depression, Recurrent , Severe, no Psychotic Features  Secondary Diagnoses: Principal Problem:   MDD (major depressive disorder), recurrent episode, severe (HCC)   Current Medications:  Current Facility-Administered Medications  Medication Dose Route Frequency Provider Last Rate Last Dose  . acetaminophen (TYLENOL) tablet 650 mg  650 mg Oral Q6H PRN Kerry HoughSpencer E Simon, PA-C      . albuterol (PROVENTIL HFA;VENTOLIN HFA) 108 (90 Base) MCG/ACT inhaler 1-2 puff  1-2 puff Inhalation Q6H PRN Kerry HoughSpencer E Simon, PA-C      . alum & mag hydroxide-simeth (MAALOX/MYLANTA) 200-200-20 MG/5ML suspension 30 mL  30 mL Oral Q4H PRN Kerry HoughSpencer E Simon, PA-C      . buPROPion (WELLBUTRIN XL) 24 hr tablet 150 mg  150 mg Oral Daily Kerry HoughSpencer E Simon, PA-C   150 mg at 03/06/16 0804  . citalopram (CELEXA) tablet 40 mg  40 mg Oral Daily Kerry HoughSpencer E Simon, PA-C   40 mg at 03/06/16 [id number]  . gabapentin (NEURONTIN) capsule 100 mg  100 mg Oral TID Craige CottaFernando A Cobos, MD   100 mg at 03/06/16 1152  . LORazepam (ATIVAN) tablet 0.5 mg  0.5 mg Oral Q6H PRN Craige CottaFernando A Cobos, MD   0.5 mg at 03/05/16 0626  . magnesium hydroxide (MILK OF MAGNESIA) suspension 30 mL  30 mL Oral Daily PRN Kerry HoughSpencer E Simon, PA-C      . metFORMIN (GLUCOPHAGE) tablet 1,000 mg  1,000 mg Oral BID WC Kerry HoughSpencer E Simon, PA-C   1,000 mg at 03/06/16 0802  . traZODone (DESYREL) tablet 50 mg  50 mg Oral QHS,MR X 1 Kerry HoughSpencer E Simon, PA-C   50 mg at 03/04/16 2107    PTA Medications: Prescriptions Prior to Admission  Medication Sig Dispense Refill Last Dose  . albuterol (PROVENTIL HFA;VENTOLIN HFA) 108 (90 BASE) MCG/ACT inhaler Inhale 1-2 puffs into the lungs every 6 (six) hours as needed for wheezing. 1 Inhaler 0 UNKNOWN  . buPROPion (WELLBUTRIN XL) 150 MG 24 hr tablet 1  qam  For 4 days then 2  qam (Patient taking  differently: Take 150 mg by mouth daily. ) 60 tablet 3 03/03/2016 at Unknown time  . citalopram (CELEXA) 40 MG tablet Take 1 tablet (40 mg total) by mouth at bedtime. 1  qam (Patient taking differently: Take 40 mg by mouth daily. ) 30 tablet 6 03/03/2016 at Unknown time  . clonazePAM (KLONOPIN) 1 MG tablet Take 1 tablet (1 mg total) by mouth at bedtime as needed for anxiety. 30 tablet 0 03/02/2016 at Unknown time  . metFORMIN (GLUCOPHAGE) 1000 MG tablet Take 1,000 mg by mouth 2 (two) times daily with a meal. Reported on 05/29/2015  4 03/03/2016 at Unknown time  . busPIRone (BUSPAR) 15 MG tablet 1  Bid for 1 week then  1  qam  2qhs (Patient not taking: Reported on 03/04/2016) 90 tablet 6 Not Taking at Unknown time    Treatment Modalities: Medication Management, Group therapy, Case management,  1 to 1 session with clinician, Psychoeducation, Recreational therapy.  Patient Stressors: Financial difficulties Medication change or noncompliance Other: estranged from her family  Patient Strengths: Capable of independent living Wellsite geologistCommunication skills General fund of knowledge Motivation for treatment/growth Religious Affiliation Work [employer]  Physician Treatment Plan for Primary Diagnosis: Major Depression, Recurrent , Severe, no Psychotic Features Long Term Goal(s): Improvement in symptoms so as ready for discharge  Short  Term Goals: Ability to verbalize feelings will improve Ability to disclose and discuss suicidal ideas Ability to demonstrate self-control will improve Ability to identify and develop effective coping behaviors will improve Ability to maintain clinical measurements within normal limits will improve Ability to verbalize feelings will improve Ability to disclose and discuss suicidal ideas Ability to demonstrate self-control will improve Ability to identify and develop effective coping behaviors will improve Ability to maintain clinical measurements within normal limits will  improve  Medication Management: Evaluate patient's response, side effects, and tolerance of medication regimen.  Therapeutic Interventions: 1 to 1 sessions, Unit Group sessions and Medication administration.  Evaluation of Outcomes: Adequate for Discharge  Physician Treatment Plan for Secondary Diagnosis: Principal Problem:   MDD (major depressive disorder), recurrent episode, severe (HCC)   Long Term Goal(s): Improvement in symptoms so as ready for discharge  Short Term Goals: Ability to verbalize feelings will improve Ability to disclose and discuss suicidal ideas Ability to demonstrate self-control will improve Ability to identify and develop effective coping behaviors will improve Ability to maintain clinical measurements within normal limits will improve Ability to verbalize feelings will improve Ability to disclose and discuss suicidal ideas Ability to demonstrate self-control will improve Ability to identify and develop effective coping behaviors will improve Ability to maintain clinical measurements within normal limits will improve  Medication Management: Evaluate patient's response, side effects, and tolerance of medication regimen.  Therapeutic Interventions: 1 to 1 sessions, Unit Group sessions and Medication administration.  Evaluation of Outcomes: Adequate for Discharge   RN Treatment Plan for Primary Diagnosis: Major Depression, Recurrent , Severe, no Psychotic Features Long Term Goal(s): Knowledge of disease and therapeutic regimen to maintain health will improve  Short Term Goals: Ability to verbalize feelings will improve, Ability to disclose and discuss suicidal ideas and Ability to identify and develop effective coping behaviors will improve  Medication Management: RN will administer medications as ordered by provider, will assess and evaluate patient's response and provide education to patient for prescribed medication. RN will report any adverse and/or side  effects to prescribing provider.  Therapeutic Interventions: 1 on 1 counseling sessions, Psychoeducation, Medication administration, Evaluate responses to treatment, Monitor vital signs and CBGs as ordered, Perform/monitor CIWA, COWS, AIMS and Fall Risk screenings as ordered, Perform wound care treatments as ordered.  Evaluation of Outcomes: Adequate for Discharge   LCSW Treatment Plan for Primary Diagnosis: Major Depression, Recurrent , Severe, no Psychotic Features Long Term Goal(s): Safe transition to appropriate next level of care at discharge, Engage patient in therapeutic group addressing interpersonal concerns.  Short Term Goals: Engage patient in aftercare planning with referrals and resources and Increase skills for wellness and recovery  Therapeutic Interventions: Assess for all discharge needs, 1 to 1 time with Social worker, Explore available resources and support systems, Assess for adequacy in community support network, Educate family and significant other(s) on suicide prevention, Complete Psychosocial Assessment, Interpersonal group therapy.  Evaluation of Outcomes: Adequate for Discharge   Progress in Treatment: Attending groups: Yes Participating in groups: No  Taking medication as prescribed: Yes, MD continues to assess for medication changes as needed Toleration medication: Yes, no side effects reported at this time Family/Significant other contact made: Yes with wife Patient understands diagnosis: Yes AEB seeking help with depression Discussing patient identified problems/goals with staff: Yes Medical problems stabilized or resolved: Yes Denies suicidal/homicidal ideation: Yes Issues/concerns per patient self-inventory: None Other: N/A  New problem(s) identified: None identified at this time.   New Short Term/Long Term Goal(s):  None identified at this time.   Discharge Plan or Barriers: Pt will return home and follow-up with outpatient services.   Reason for  Continuation of Hospitalization: None identified at this time.   Estimated Length of Stay: 0 days  Attendees: Patient: 03/06/2016  12:34 PM  Physician: Dr. Jama Flavorsobos 03/06/2016  12:34 PM  Nursing: Rica MastElizabeth I., Caroline Beaudry, RN 03/06/2016  12:34 PM  RN Care Manager:  03/06/2016  12:34 PM  Social Worker: Vernie ShanksLauren Olive Motyka, LCSW; Belenda CruiseKristin Drinkard, LCSW 03/06/2016  12:34 PM  Recreational Therapist:  03/06/2016  12:34 PM  Other: Armandina StammerAgnes Nwoko, NP; Gray BernhardtMay Augustin, NP 03/06/2016  12:34 PM  Other:  03/06/2016  12:34 PM  Other: 03/06/2016  12:34 PM    Scribe for Treatment Team: Verdene LennertLauren C Elody Kleinsasser, LCSW 03/06/2016 12:34 PM

## 2016-03-06 NOTE — Progress Notes (Signed)
Vitals entered on behalf of night shift [facility] 

## 2016-03-06 NOTE — Discharge Summary (Signed)
Physician Discharge Summary Note  Patient:  [name] is an 40 y.o., female MRN:  [id number] DOB:  [birth year] Patient phone:  [phone] (home)  Patient address:   [address] [place] [address],  Total Time spent with patient: 30 minutes  Date of Admission:  03/03/2016 Date of Discharge: 03/06/2016  Reason for Admission:  Suicidal thoughts and depression worsening  Principal Problem: MDD (major depressive disorder), recurrent episode, severe (HCC) Discharge Diagnoses: Patient Active Problem List   Diagnosis Date Noted  . MDD (major depressive disorder), recurrent episode, severe (HCC) [F33.2] 03/03/2016  . Major depressive disorder, recurrent episode, moderate (HCC) [F33.1] 06/20/2012    Class: Chronic    Past Psychiatric History: see HPI  Past Medical History:  Past Medical History:  Diagnosis Date  . Diabetes mellitus without complication (HCC)   . Obesity   . Pre-diabetes     Past Surgical History:  Procedure Laterality Date  . BACK SURGERY  2004  . SPINAL FUSION  2004  . uterine ablation  2010   Family History:  Family History  Problem Relation Age of Onset  . Heart Problems Father    Family Psychiatric  History: see HPI Social History:  History  Alcohol Use No     History  Drug Use No    Social History   Social History  . Marital status: Single    Spouse name: N/A  . Number of children: N/A  . Years of education: N/A   Social History Main Topics  . Smoking status: Never Smoker  . Smokeless tobacco: Never Used  . Alcohol use No  . Drug use: No  . Sexual activity: Yes   Other Topics Concern  . None   Social History Narrative  . None    Hospital Course:  [name] is an 40 y.o. female [facility] with complaints of suicidal thoughts, depression, and anxiety.  She has tried to commit suicide 3x's in the past (overdoses).  [facility].  [name] was admitted for  Severe episode of recurrent major depressive disorder, without psychotic features (HCC) and crisis management.  Patient was treated with medications with their indications listed below in detail under Medication List.  Medical problems were identified and treated as needed.  Home medications were restarted as appropriate.  Improvement was monitored by observation and [name] daily report of symptom reduction.  Emotional and mental status was monitored by daily self inventory reports completed by Christie BenderAmber L Sharples and clinical staff.  Patient reported continued improvement, denied any new concerns.  Patient had been compliant on medications and denied side effects.  Support and encouragement was provided.         [name] was evaluated by the treatment team for stability and plans for continued recovery upon discharge.  Patient was offered further treatment options upon discharge including Residential, Intensive Outpatient and Outpatient treatment. Patient will follow up with agency listed below for medication management and counseling.  Encouraged patient to maintain satisfactory support network and home environment.  Advised to adhere to medication compliance and outpatient treatment follow up.  Prescriptions provided.       [name] motivation was an integral factor for scheduling further treatment.  Employment, transportation, bed availability, health status, family support, and any pending legal issues were also considered during patient's hospital stay.  Upon completion of this admission the patient was both mentally and medically stable  for discharge denying suicidal/homicidal ideation, auditory/visual/tactile hallucinations, delusional thoughts and paranoia.      Physical Findings: AIMS: Facial and Oral Movements Muscles of Facial Expression: None, normal Lips and Perioral Area: None, normal Jaw: None, normal Tongue: None, normal,Extremity Movements Upper (arms,  wrists, hands, fingers): None, normal Lower (legs, knees, ankles, toes): None, normal, Trunk Movements Neck, shoulders, hips: None, normal, Overall Severity Severity of abnormal movements (highest score from questions above): None, normal Incapacitation due to abnormal movements: None, normal Patient's awareness of abnormal movements (rate only patient's report): No Awareness, Dental Status Current problems with teeth and/or dentures?: No Does patient usually wear dentures?: No  CIWA:    COWS:     Musculoskeletal: Strength & Muscle Tone: within normal limits Gait & Station: normal Patient leans: N/A  Psychiatric Specialty Exam:  SEE MD SRA Physical Exam  ROS  Blood pressure 101/77, pulse 97, temperature 98.2 F (36.8 C), temperature source Oral, resp. rate 18, height 5' 5.5" (1.664 m), weight 118.4 kg (261 lb).Body mass index is 42.77 kg/m.   Have you used any form of tobacco in the last 30 days? (Cigarettes, Smokeless Tobacco, Cigars, and/or Pipes): No  Has this patient used any form of tobacco in the last 30 days? (Cigarettes, Smokeless Tobacco, Cigars, and/or Pipes) Yes, N/A  Blood Alcohol level:  Lab Results  Component Value Date   [facility] <5 03/03/2016   ETH <11 06/20/2012    Metabolic Disorder Labs:  Lab Results  Component Value Date   HGBA1C 9.0 (H) 03/04/2016   MPG 212 03/04/2016   MPG 174 (H) 06/20/2012   Lab Results  Component Value Date   PROLACTIN 28.8 (H) 03/04/2016   Lab Results  Component Value Date   CHOL 164 06/21/2012   TRIG 84 06/21/2012   HDL 39 (L) 06/21/2012   CHOLHDL 4.2 06/21/2012   VLDL 17 06/21/2012   LDLCALC 108 (H) 06/21/2012    See Psychiatric Specialty Exam and Suicide Risk Assessment completed by Attending Physician prior to discharge.  Discharge destination:  Home  Is patient on multiple antipsychotic therapies at discharge:  No   Has Patient had three or more failed trials of antipsychotic monotherapy by history:   No  Recommended Plan for Multiple Antipsychotic Therapies: NA   Allergies as of 03/06/2016      Reactions   Other Other (See Comments)   All antibiotics-MUST HAVE ZOFRAN OR PHENERGAN BEFORE MED   Bee Venom Swelling, Other (See Comments)   "My arm begin to swell"      Medication List    STOP taking these medications   albuterol 108 (90 Base) MCG/ACT inhaler Commonly known as:  PROVENTIL HFA;VENTOLIN HFA   busPIRone 15 MG tablet Commonly known as:  BUSPAR   clonazePAM 1 MG tablet Commonly known as:  KLONOPIN     TAKE these medications     Indication  buPROPion 150 MG 24 hr tablet Commonly known as:  WELLBUTRIN XL Take 1 tablet (150 mg total) by mouth daily. Start taking on:  03/07/2016  Indication:  Major Depressive Disorder   citalopram 40 MG tablet Commonly known as:  CELEXA Take 1 tablet (40 mg total) by mouth daily. Start taking on:  03/07/2016  Indication:  Depression   gabapentin 100 MG capsule Commonly known as:  NEURONTIN Take 1 capsule (100 mg total) by mouth 3 (three) times daily.  Indication:  Agitation   metFORMIN 1000 MG tablet Commonly known as:  GLUCOPHAGE Take 1 tablet (1,000 mg total) by  mouth 2 (two) times daily with a meal. What changed:  additional instructions  Indication:  Type 2 Diabetes   traZODone 50 MG tablet Commonly known as:  DESYREL Take 1 tablet (50 mg total) by mouth at bedtime and may repeat dose one time if needed.  Indication:  Trouble Sleeping      Follow-up Information    [facility] Follow up on 03/11/2016.   Specialty:  Behavioral Health Why:  at 3:45pm with Dr. Donell BeersPlovsky for medication management. Contact information: [address] [place] [phone]       Jorje GuildMary Sue Olcott,LCSW Follow up on 03/12/2016.   Why:  at 5:00pm for therapy.  Contact information: [address]  [place] [id number] [phone]            Follow-up recommendations:  Activity:  as tol Diet:  as tol  Comments:  1.  Take all your medications as prescribed.   2.  Report any adverse side effects to outpatient provider. 3.  Patient instructed to not use alcohol or illegal drugs while on prescription medicines. 4.  In the event of worsening symptoms, instructed patient to call 911, the crisis hotline or go to nearest emergency room for evaluation of symptoms.  Signed: Lindwood QuaSheila May Agustin, NP [facility] 03/06/2016, 11:46 AM   Patient seen, Suicide Assessment Completed.  Disposition Plan Reviewed

## 2016-03-06 NOTE — Progress Notes (Signed)
[facility] Group Notes:  (Nursing/MHT/Case Management/Adjunct)  Date:  03/06/2016  Time:  12:02 PM  Type of Therapy:  Psychoeducational Skills  Participation Level:  Active  Participation Quality:  Appropriate  Affect:  Appropriate  Cognitive:  Appropriate  Insight:  Appropriate  Engagement in Group:  Engaged  Modes of Intervention:  Activity  Summary of Progress/Problems:  Margy ClarksCook, Jerik Falletta D 03/06/2016, 12:02 PM

## 2016-03-11 ENCOUNTER — Ambulatory Visit (INDEPENDENT_AMBULATORY_CARE_PROVIDER_SITE_OTHER): Payer: No Typology Code available for payment source | Admitting: Psychiatry

## 2016-03-11 VITALS — BP 132/80 | HR 83 | Ht 66.0 in | Wt 260.4 lb

## 2016-03-11 DIAGNOSIS — Z79899 Other long term (current) drug therapy: Secondary | ICD-10-CM | POA: Diagnosis not present

## 2016-03-11 DIAGNOSIS — F329 Major depressive disorder, single episode, unspecified: Secondary | ICD-10-CM

## 2016-03-11 DIAGNOSIS — Z8249 Family history of ischemic heart disease and other diseases of the circulatory system: Secondary | ICD-10-CM | POA: Diagnosis not present

## 2016-03-11 DIAGNOSIS — Z9889 Other specified postprocedural states: Secondary | ICD-10-CM | POA: Diagnosis not present

## 2016-03-11 MED ORDER — GABAPENTIN 100 MG PO CAPS: ORAL_CAPSULE | ORAL | 4 refills | Status: DC

## 2016-03-11 NOTE — Progress Notes (Signed)
[facility]  03/11/2016 4:18 PM Lorayne Bendermber L Ribera  MRN:  [id number] Subjective: Feeling well Principal Problem: Major depression, recurrent episode remission Diagnosis:  Major depression, recurrent episode rem  Today the patient is seen in a follow-up appointment. She was hospitalized Behavioral Health for 4 days. The patient said she had mounting anxiety and felt overwhelmed. She didn't feel safe with herself. I suspect she is having some issues related to her relationship. The patient is actively in therapy. Unfortunately she didn't seem to be able to soothe her comfort herself. She herself voluntarily came into the hospital. In the hospital they stop the Klonopin that we apparently started a week or 2 earlier. She's placed on Neurontin 100 mg 3 times a day which has been helpful. She says she significantly improved but not near her baseline. Patient says she sleeping and eating well. She is not suicidal. She's been back to work and doing okay. At this time the patient denies the use of alcohol or drugs. She denies psychotic symptoms. At this time she's not suicidal. She seems more contained. She does say however her sleep is still somewhat broken up. She received a prescription for trazodone that she did not fill as of yet. Past Psychiatric History:   Past Medical History:  Past Medical History:  Diagnosis Date  . Diabetes mellitus without complication (HCC)   . Obesity   . Pre-diabetes     Past Surgical History:  Procedure Laterality Date  . BACK SURGERY  2004  . SPINAL FUSION  2004  . uterine ablation  2010   Family History:  Family History  Problem Relation Age of Onset  . Heart Problems Father    Family Psychiatric  History:  Social History:  History  Alcohol Use No     History  Drug Use No    Social History   Social History  . Marital status: Single    Spouse name: N/A  . Number of children: N/A  . Years of education: N/A   Social History Main Topics  .  Smoking status: Never Smoker  . Smokeless tobacco: Never Used  . Alcohol use No  . Drug use: No  . Sexual activity: Yes   Other Topics Concern  . Not on file   Social History Narrative  . No narrative on file   Additional Social History:                         Sleep: Good  Appetite:  Good  Current Medications: Current Outpatient Prescriptions  Medication Sig Dispense Refill  . buPROPion (WELLBUTRIN XL) 150 MG 24 hr tablet Take 1 tablet (150 mg total) by mouth daily. 30 tablet 0  . citalopram (CELEXA) 40 MG tablet Take 1 tablet (40 mg total) by mouth daily. 30 tablet 0  . gabapentin (NEURONTIN) 100 MG capsule 2  tid 180 capsule 4  . metFORMIN (GLUCOPHAGE) 1000 MG tablet Take 1 tablet (1,000 mg total) by mouth 2 (two) times daily with a meal. 60 tablet 0  . traZODone (DESYREL) 50 MG tablet Take 1 tablet (50 mg total) by mouth at bedtime and may repeat dose one time if needed. 30 tablet 0   No current facility-administered medications for this visit.     Lab Results: No results found for this or any previous visit (from the past 48 hour(s)).  Blood Alcohol level:  Lab Results  Component Value Date   [facility] <[facility] 03/03/2016  ETH <11 06/20/2012    Physical Findings: AIMS:  , ,  ,  ,    CIWA:    COWS:     Musculoskeletal: Strength & Muscle Tone: within normal limits Gait & Station: normal Patient leans: N/A  Psychiatric Specialty Exam: ROS  Blood pressure 132/80, pulse 83, height 5\' 6"  (1.676 m), weight 260 lb 6.4 oz (118.1 kg).Body mass index is 42.03 kg/m.  General Appearance: Casual  Eye Contact::  Good  Speech:  Clear and Coherent  Volume:  Normal  Mood:  Euthymic  Affect:  Congruent  Thought Process:  Coherent  Orientation:  Full (Time, Place, and Person)  Thought Content:  WDL  Suicidal Thoughts:  No  Homicidal Thoughts:  No  Memory:  NA  Judgement:  Good  Insight:  Fair  Psychomotor Activity:  Normal  Concentration:  Good  Recall:  Good   Fund of Knowledge:Good  Language: Good  Akathisia:  No  Handed:  Right  AIMS (if indicated):     Assets:  Desire for Improvement  ADL's:  Intact  Cognition: WNL  Sleep:      Treatment Plan Summary: At this time the patient will continue taking her Celexa 40 mg and her Wellbutrin 300 mg. We never started her BuSpar but we did start some Klonopin. I fear the Klonopin ended up causing some withdrawal late in the morning which added to her anxiety and even converted to some degree of anger. She eventually became so distressed she needed to be hospitalized. In the hospital he discontinued her Klonopin and put her on Neurontin 100 mg 3 times a day. At this time we'll go ahead and increase it somewhat to making a 200 mg 3 times a day. The patient was given the latitude that if she still was not sleeping she did reduce the p.m. Neurontin down to 100 mg and start on some trazodone that she received from the hospital. This patient has a therapy appointment in a few days with her therapist. Doreatha LewShe'll return to see me in 4 weeks. At this time the patient feels physically well. Generally she is sleeping and eating well. She denies any suicidal or homicidal thoughts. Lucas MallowGerald Irving Jermery Caratachea, MD 03/11/2016, 4:18 PM

## 2016-04-14 ENCOUNTER — Encounter (HOSPITAL_COMMUNITY): Payer: Self-pay | Admitting: Psychiatry

## 2016-04-14 ENCOUNTER — Ambulatory Visit (INDEPENDENT_AMBULATORY_CARE_PROVIDER_SITE_OTHER): Payer: No Typology Code available for payment source | Admitting: Psychiatry

## 2016-04-14 VITALS — BP 118/82 | HR 77 | Ht 67.0 in | Wt 262.0 lb

## 2016-04-14 DIAGNOSIS — F333 Major depressive disorder, recurrent, severe with psychotic symptoms: Secondary | ICD-10-CM

## 2016-04-14 DIAGNOSIS — Z8249 Family history of ischemic heart disease and other diseases of the circulatory system: Secondary | ICD-10-CM | POA: Diagnosis not present

## 2016-04-14 DIAGNOSIS — Z9889 Other specified postprocedural states: Secondary | ICD-10-CM | POA: Diagnosis not present

## 2016-04-14 DIAGNOSIS — Z79899 Other long term (current) drug therapy: Secondary | ICD-10-CM

## 2016-04-14 MED ORDER — TRAZODONE HCL 50 MG PO TABS: ORAL_TABLET | ORAL | 5 refills | Status: DC

## 2016-04-14 MED ORDER — TRAZODONE HCL 50 MG PO TABS: ORAL_TABLET | ORAL | 6 refills | Status: DC

## 2016-04-14 MED ORDER — TRAZODONE HCL 50 MG PO TABS: 50.0000 mg | ORAL_TABLET | Freq: Every evening | ORAL | 0 refills | Status: DC | PRN

## 2016-04-14 NOTE — Progress Notes (Signed)
[facility]  04/14/2016 4:49 PM [name]  MRN:  [id number] Subjective: Feeling well Principal Problem: Major depression, recurrent episode remission Diagnosis:  Major depression, recurrent episode rem Today the patient is fairly well. She has had some stressful events. Includes the attending of her relationship. She's now separated from her partner 15 years. The patient is actively seeing her therapist and a regular basis. It turns out that she's been a stay with her daughter in her home and her partner's gun removed. The patient's sleep is somewhat erratic but she does not take naps and does not feel sleepy. Today we clarified that she should be taking 300 mg of Wellbutrin instead of the 150s take she still takes Celexa 40 mg. The patient is eating well has good energy and goes to work every day. She feels good at work. As always she's busy she feels okay. Patient denies being suicidal. She denies use of any alcohol or drugs. She denies any chest pain or shortness of breath. Past Psychiatric History:   Past Medical History:  Past Medical History:  Diagnosis Date  . Diabetes mellitus without complication (HCC)   . Obesity   . Pre-diabetes     Past Surgical History:  Procedure Laterality Date  . BACK SURGERY  2004  . SPINAL FUSION  2004  . uterine ablation  2010   Family History:  Family History  Problem Relation Age of Onset  . Heart Problems Father    Family Psychiatric  History:  Social History:  History  Alcohol Use No     History  Drug Use No    Social History   Social History  . Marital status: Single    Spouse name: N/A  . Number of children: N/A  . Years of education: N/A   Social History Main Topics  . Smoking status: Never Smoker  . Smokeless tobacco: Never Used  . Alcohol use No  . Drug use: No  . Sexual activity: Not Currently   Other Topics Concern  . None   Social History Narrative  . None   Additional Social History:                          Sleep: Good  Appetite:  Good  Current Medications: Current Outpatient Prescriptions  Medication Sig Dispense Refill  . buPROPion (WELLBUTRIN XL) 150 MG 24 hr tablet Take 1 tablet (150 mg total) by mouth daily. 30 tablet 0  . citalopram (CELEXA) 40 MG tablet Take 1 tablet (40 mg total) by mouth daily. 30 tablet 0  . gabapentin (NEURONTIN) 100 MG capsule 2  tid 180 capsule 4  . metFORMIN (GLUCOPHAGE) 1000 MG tablet Take 1 tablet (1,000 mg total) by mouth 2 (two) times daily with a meal. 60 tablet 0  . traZODone (DESYREL) 50 MG tablet 2  qhs 60 tablet 6  . traZODone (DESYREL) 50 MG tablet 2  qhs 60 tablet 5   No current facility-administered medications for this visit.     Lab Results: No results found for this or any previous visit (from the past 48 hour(s)).  Blood Alcohol level:  Lab Results  Component Value Date   [facility] <[facility] 03/03/2016   ETH <11 06/20/2012    Physical Findings: AIMS:  , ,  ,  ,    CIWA:    COWS:     Musculoskeletal: Strength & Muscle Tone: within normal limits Gait & Station: normal Patient leans:  N/A  Psychiatric Specialty Exam: ROS  Blood pressure 118/82, pulse 77, height 5\' 7"  (1.702 m), weight 262 lb (118.8 kg).Body mass index is 41.04 kg/m.  General Appearance: Casual  Eye Contact::  Good  Speech:  Clear and Coherent  Volume:  Normal  Mood:  Euthymic  Affect:  Congruent  Thought Process:  Coherent  Orientation:  Full (Time, Place, and Person)  Thought Content:  WDL  Suicidal Thoughts:  No  Homicidal Thoughts:  No  Memory:  NA  Judgement:  Good  Insight:  Fair  Psychomotor Activity:  Normal  Concentration:  Good  Recall:  Good  Fund of Knowledge:Good  Language: Good  Akathisia:  No  Handed:  Right  AIMS (if indicated):     Assets:  Desire for Improvement  ADL's:  Intact  Cognition: WNL  Sleep:      Treatment Plan Summary: This patient was standing all the medicine she is taking her this includes Neurontin  200 mg 3 times a day, trazodone 50 mg which she'll take 1 or 2 at night continue Celexa 40 mg and adjust up her Wellbutrin to the full appropriate dose of 300 mg. The patient clearly is dealing with the stress of losing her relationship after 15 years seems actually to be tolerating it well. She does have friends who are supportive. She can focus and organize enough to go to work still perform fairly well. She certainly is not suicidal she is functioning. This patient will return to see me in 2 months. She'll continue in weekly one-to-one therapy. She understands that she'll call if there are problems.  04/14/2016, 4:49 PM

## 2016-05-12 ENCOUNTER — Other Ambulatory Visit: Payer: Self-pay | Admitting: Internal Medicine

## 2016-05-12 DIAGNOSIS — Z1231 Encounter for screening mammogram for malignant neoplasm of breast: Secondary | ICD-10-CM

## 2016-05-27 ENCOUNTER — Ambulatory Visit: Payer: Self-pay

## 2016-06-02 ENCOUNTER — Telehealth (HOSPITAL_COMMUNITY): Payer: Self-pay

## 2016-06-02 ENCOUNTER — Other Ambulatory Visit (HOSPITAL_COMMUNITY): Payer: Self-pay

## 2016-06-02 NOTE — Telephone Encounter (Signed)
Patient is calling because she is having trouble sleeping. Patient currently takes 100 mg of trazodone at bedtime, but is waking up after a few hours. Please review and advise, thank you

## 2016-06-03 ENCOUNTER — Other Ambulatory Visit (HOSPITAL_COMMUNITY): Payer: Self-pay

## 2016-06-03 MED ORDER — TRAZODONE HCL 100 MG PO TABS: ORAL_TABLET | ORAL | 2 refills | Status: DC

## 2016-06-03 NOTE — Telephone Encounter (Signed)
Per Dr. Donell BeersPlovsky I sent in a new prescription to the pharmacy for trazodone 100 mg 1-2 po qhs prn. I called patient and she is aware

## 2016-06-11 ENCOUNTER — Ambulatory Visit (INDEPENDENT_AMBULATORY_CARE_PROVIDER_SITE_OTHER): Payer: No Typology Code available for payment source | Admitting: Psychiatry

## 2016-06-11 ENCOUNTER — Encounter (HOSPITAL_COMMUNITY): Payer: Self-pay | Admitting: Psychiatry

## 2016-06-11 VITALS — BP 108/64 | HR 75 | Ht 67.0 in | Wt 267.0 lb

## 2016-06-11 DIAGNOSIS — Z79899 Other long term (current) drug therapy: Secondary | ICD-10-CM | POA: Diagnosis not present

## 2016-06-11 DIAGNOSIS — F339 Major depressive disorder, recurrent, unspecified: Secondary | ICD-10-CM | POA: Diagnosis not present

## 2016-06-11 MED ORDER — BUPROPION HCL ER (XL) 300 MG PO TB24: 300.0000 mg | ORAL_TABLET | Freq: Every day | ORAL | 5 refills | Status: DC

## 2016-06-11 MED ORDER — TRAZODONE HCL 100 MG PO TABS: ORAL_TABLET | ORAL | 5 refills | Status: DC

## 2016-06-11 NOTE — Progress Notes (Signed)
[facility] MD Progress Note  06/11/2016 5:06 PM [name]  MRN:  [id number] Subjective: Feeling well Principal Problem: Major depression, recurrent episode remission Diagnosis:  Major depression, recurrent episode   Today the patient is seen on time. The patient is doing only fairly well. She now lives independently from her partner. She actually does live with her daughter which is good. Patient is learning how to spend time alone. She has to define the friends that she has to be there for her supposed to be for her partner. The patient relates that she is sleeping only fairly well. Trying to increase her trazodone. I think that she'll do best by taking melatonin together with the higher dose of trazodone. The patient is eating well. The patient's energy level is fairly good. I think her thinking and concentration is stable. She is working fairly successful. She's able to read. She likes music and fortunately she has her to cats. Patient is not psychotic. The patient shows no evidence of suicidal thinking. The patient does not use any drugs or any alcohol. Her breakup is painful for her. She fortunately has a good relationship with her therapist Jorje Guild.  Past Medical History:  Past Medical History:  Diagnosis Date  . Diabetes mellitus without complication (HCC)   . Obesity   . Pre-diabetes     Past Surgical History:  Procedure Laterality Date  . BACK SURGERY  2004  . SPINAL FUSION  2004  . uterine ablation  2010   Family History:  Family History  Problem Relation Age of Onset  . Heart Problems Father    Family Psychiatric  History:  Social History:  History  Alcohol Use No     History  Drug Use No    Social History   Social History  . Marital status: Single    Spouse name: N/A  . Number of children: N/A  . Years of education: N/A   Social History Main Topics  . Smoking status: Never Smoker  . Smokeless tobacco: Never Used  . Alcohol use No  . Drug use: No  . Sexual  activity: Not Currently   Other Topics Concern  . None   Social History Narrative  . None   Additional Social History:                         Sleep: Good  Appetite:  Good  Current Medications: Current Outpatient Prescriptions  Medication Sig Dispense Refill  . buPROPion (WELLBUTRIN XL) 300 MG 24 hr tablet Take 1 tablet (300 mg total) by mouth daily. 30 tablet 5  . citalopram (CELEXA) 40 MG tablet Take 1 tablet (40 mg total) by mouth daily. 30 tablet 0  . gabapentin (NEURONTIN) 100 MG capsule 2  tid 180 capsule 4  . Melatonin 5 MG LOZG Place 2 mg under the tongue at bedtime.    . metFORMIN (GLUCOPHAGE) 1000 MG tablet Take 1 tablet (1,000 mg total) by mouth 2 (two) times daily with a meal. 60 tablet 0  . PROAIR HFA 108 (90 Base) MCG/ACT inhaler Take 90 mcg by mouth 4 (four) times daily.  2  . traZODone (DESYREL) 100 MG tablet 2  qhs 60 tablet 5  . traZODone (DESYREL) 100 MG tablet Take 1-2 tablets at bedtime as needed for sleep (Patient not taking: Reported on 06/11/2016) 60 tablet 2   No current facility-administered medications for this visit.     Lab Results: No results found for  this or any previous visit (from the past 48 hour(s)).  Blood Alcohol level:  Lab Results  Component Value Date   [facility] 03/03/2016   ETH <11 06/20/2012    Physical Findings: AIMS:  , ,  ,  ,    CIWA:    COWS:     Musculoskeletal: Strength & Muscle Tone: within normal limits Gait & Station: normal Patient leans: N/A  Psychiatric Specialty Exam: ROS  Blood pressure 108/64, pulse 75, height 5\' 7"  (1.702 m), weight 267 lb (121.1 kg).Body mass index is 41.82 kg/m.  General Appearance: Casual  Eye Contact::  Good  Speech:  Clear and Coherent  Volume:  Normal  Mood:  Euthymic  Affect:  Congruent  Thought Process:  Coherent  Orientation:  Full (Time, Place, and Person)  Thought Content:  WDL  Suicidal Thoughts:  No  Homicidal Thoughts:  No  Memory:  NA  Judgement:  Good   Insight:  Fair  Psychomotor Activity:  Normal  Concentration:  Good  Recall:  Good  Fund of Knowledge:Good  Language: Good  Akathisia:  No  Handed:  Right  AIMS (if indicated):     Assets:  Desire for Improvement  ADL's:  Intact  Cognition: WNL  Sleep:      Treatment Plan Summary: This patient will continue taking Celexa 40 mg and will change her Wellbutrin to 300 mg XL pill. Today I chose not to increase her Wellbutrin. When she began to feel that she has to take action and take care of herself. She is not suicidal. She has few vegetative symptoms at this time. He's very committed to her daughter. They just found out the joint custody. The patient is not suicidal. Physically she is stable. At this time she'll begin taking melatonin 3 mg over-the-counter together with higher dose of trazodone 200 mg. This patient return back to see me in 3 months. The patient was instructed to call if this is any problem. 06/11/2016, 5:06 PM

## 2016-06-16 ENCOUNTER — Ambulatory Visit
Admission: RE | Admit: 2016-06-16 | Discharge: 2016-06-16 | Disposition: A | Discharge status: Home / Self Care | Payer: PRIVATE HEALTH INSURANCE | Source: Ambulatory Visit | Attending: Internal Medicine | Admitting: Internal Medicine

## 2016-06-16 DIAGNOSIS — Z1231 Encounter for screening mammogram for malignant neoplasm of breast: Secondary | ICD-10-CM

## 2016-09-29 ENCOUNTER — Other Ambulatory Visit (HOSPITAL_COMMUNITY): Payer: Self-pay | Admitting: Psychiatry

## 2016-10-01 ENCOUNTER — Ambulatory Visit (INDEPENDENT_AMBULATORY_CARE_PROVIDER_SITE_OTHER): Payer: No Typology Code available for payment source | Admitting: Psychiatry

## 2016-10-01 ENCOUNTER — Encounter (HOSPITAL_COMMUNITY): Payer: Self-pay | Admitting: Psychiatry

## 2016-10-01 VITALS — BP 104/68 | HR 83 | Ht 65.5 in | Wt 267.0 lb

## 2016-10-01 DIAGNOSIS — F320 Major depressive disorder, single episode, mild: Secondary | ICD-10-CM | POA: Diagnosis not present

## 2016-10-01 MED ORDER — TRAZODONE HCL 100 MG PO TABS: ORAL_TABLET | ORAL | 2 refills | Status: DC

## 2016-10-01 MED ORDER — CITALOPRAM HYDROBROMIDE 40 MG PO TABS: 40.0000 mg | ORAL_TABLET | Freq: Every day | ORAL | 0 refills | Status: DC

## 2016-10-01 MED ORDER — BUPROPION HCL ER (XL) 300 MG PO TB24: 300.0000 mg | ORAL_TABLET | Freq: Every day | ORAL | 5 refills | Status: DC

## 2016-10-01 NOTE — Progress Notes (Signed)
[facility]  10/01/2016 4:13 PM [name]  MRN:  [id number] Subjective: Feeling well Principal Problem: Major depression, recurrent episode remission Diagnosis:  Major depression, recurrent episode rem Today the patient is doing well. Her mood is better. She's getting over the breakup with her partner. She continues to work. She denies persistent daily depression. She sleeping and eating well. She shows no signs of anhedonia. She sings in a choir.  She loves her cat. Overall the patient is doing well. She shows no evidence suicidal thinking. She takes her medicines just as prescribed. She shows no evidence of psychosis. She drinks no alcohol or drugs. He is actively engaged with her therapist. Work is going well. She likes the people she works with and what she does. Past Medical History:  Past Medical History:  Diagnosis Date  . Diabetes mellitus without complication (HCC)   . Obesity   . Pre-diabetes     Past Surgical History:  Procedure Laterality Date  . BACK SURGERY  2004  . SPINAL FUSION  2004  . uterine ablation  2010   Family History:  Family History  Problem Relation Age of Onset  . Heart Problems Father   . Breast cancer Maternal Aunt   . Breast cancer Maternal Grandmother   . Breast cancer Maternal Aunt    Family Psychiatric  History:  Social History:  History  Alcohol Use No    Comment: Occasion mixed drink once a month     History  Drug Use No    Social History   Social History  . Marital status: Single    Spouse name: N/A  . Number of children: N/A  . Years of education: N/A   Social History Main Topics  . Smoking status: Never Smoker  . Smokeless tobacco: Never Used  . Alcohol use No     Comment: Occasion mixed drink once a month  . Drug use: No  . Sexual activity: Not Currently   Other Topics Concern  . None   Social History Narrative  . None   Additional Social History:                         Sleep:  Good  Appetite:  Good  Current Medications: Current Outpatient Prescriptions  Medication Sig Dispense Refill  . buPROPion (WELLBUTRIN XL) 300 MG 24 hr tablet Take 1 tablet (300 mg total) by mouth daily. 30 tablet 5  . citalopram (CELEXA) 40 MG tablet Take 1 tablet (40 mg total) by mouth daily. 30 tablet 0  . gabapentin (NEURONTIN) 100 MG capsule 2  tid 180 capsule 4  . Melatonin 5 MG LOZG Place 2 mg under the tongue at bedtime.    . metFORMIN (GLUCOPHAGE) 1000 MG tablet Take 1 tablet (1,000 mg total) by mouth 2 (two) times daily with a meal. 60 tablet 0  . PROAIR HFA 108 (90 Base) MCG/ACT inhaler Take 90 mcg by mouth 4 (four) times daily.  2  . traZODone (DESYREL) 100 MG tablet Take 1-2 tablets at bedtime as needed for sleep 60 tablet 2  . traZODone (DESYREL) 100 MG tablet 2  qhs 60 tablet 5   No current facility-administered medications for this visit.     Lab Results: No results found for this or any previous visit (from the past 48 hour(s)).  Blood Alcohol level:  Lab Results  Component Value Date   [facility] 03[facility]   ETH <11 06/20/2012  Physical Findings: AIMS:  , ,  ,  ,    CIWA:    COWS:     Musculoskeletal: Strength & Muscle Tone: within normal limits Gait & Station: normal Patient leans: N/A  Psychiatric Specialty Exam: ROS  Blood pressure 104/68, pulse 83, height 5' 5.5" (1.664 m), weight 267 lb (121.1 kg), SpO2 98 %.Body mass index is 43.76 kg/m.  General Appearance: Casual  Eye Contact::  Good  Speech:  Clear and Coherent  Volume:  Normal  Mood:  Euthymic  Affect:  Congruent  Thought Process:  Coherent  Orientation:  Full (Time, Place, and Person)  Thought Content:  WDL  Suicidal Thoughts:  No  Homicidal Thoughts:  No  Memory:  NA  Judgement:  Good  Insight:  Fair  Psychomotor Activity:  Normal  Concentration:  Good  Recall:  Good  Fund of Knowledge:Good  Language: Good  Akathisia:  No  Handed:  Right  AIMS (if indicated):     Assets:   Desire for Improvement  ADL's:  Intact  Cognition: WNL  Sleep:      Treatment Plan Summary: At this time the patient continue taking Celexa 40 mg and continue taking 300 mg Wellbutrin. She takes 50 mg of trazodone. She takes the melatonin. Patient is doing well. She's very the therapy. She's not suicidal. She's been in the right direction. She'll return to see me in 5 months for reevaluation

## 2016-10-08 ENCOUNTER — Other Ambulatory Visit (HOSPITAL_COMMUNITY): Payer: Self-pay

## 2016-10-08 MED ORDER — GABAPENTIN 100 MG PO CAPS
ORAL_CAPSULE | ORAL | 4 refills | Status: DC
Start: 2016-10-08 — End: 2017-03-10

## 2016-11-27 ENCOUNTER — Other Ambulatory Visit (HOSPITAL_COMMUNITY): Payer: Self-pay | Admitting: Psychiatry

## 2016-12-07 ENCOUNTER — Other Ambulatory Visit (HOSPITAL_COMMUNITY): Payer: Self-pay | Admitting: Psychiatry

## 2016-12-07 DIAGNOSIS — F320 Major depressive disorder, single episode, mild: Secondary | ICD-10-CM

## 2016-12-11 NOTE — Telephone Encounter (Signed)
Met with Dr. Casimiro Needle who reviewed patient's record and requested this nurse send her pharmacy a new Celexa order + 2 refills to last until patient returns to see him 03/03/17.  New orders completed as verbally authorized and [facility].

## 2017-01-20 ENCOUNTER — Telehealth (HOSPITAL_COMMUNITY): Payer: Self-pay | Admitting: Psychiatry

## 2017-03-01 ENCOUNTER — Other Ambulatory Visit (HOSPITAL_COMMUNITY): Payer: Self-pay | Admitting: Psychiatry

## 2017-03-01 DIAGNOSIS — F320 Major depressive disorder, single episode, mild: Secondary | ICD-10-CM

## 2017-03-02 ENCOUNTER — Other Ambulatory Visit (HOSPITAL_COMMUNITY): Payer: Self-pay | Admitting: Psychiatry

## 2017-03-03 ENCOUNTER — Ambulatory Visit (INDEPENDENT_AMBULATORY_CARE_PROVIDER_SITE_OTHER): Payer: No Typology Code available for payment source | Admitting: Psychiatry

## 2017-03-03 ENCOUNTER — Encounter (HOSPITAL_COMMUNITY): Payer: Self-pay | Admitting: Psychiatry

## 2017-03-03 VITALS — BP 132/76 | HR 92 | Ht 66.0 in | Wt 274.0 lb

## 2017-03-03 DIAGNOSIS — F320 Major depressive disorder, single episode, mild: Secondary | ICD-10-CM

## 2017-03-03 DIAGNOSIS — F325 Major depressive disorder, single episode, in full remission: Secondary | ICD-10-CM | POA: Diagnosis not present

## 2017-03-03 MED ORDER — CITALOPRAM HYDROBROMIDE 40 MG PO TABS: ORAL_TABLET | ORAL | 6 refills | Status: DC

## 2017-03-03 MED ORDER — TRAZODONE HCL 50 MG PO TABS: ORAL_TABLET | ORAL | 6 refills | Status: DC

## 2017-03-03 MED ORDER — BUPROPION HCL ER (XL) 300 MG PO TB24: 300.0000 mg | ORAL_TABLET | Freq: Every day | ORAL | 5 refills | Status: DC

## 2017-03-03 NOTE — Progress Notes (Signed)
[facility] MD Progress Note  03/03/2017 3:58 PM [name]  MRN:  [id number] Subjective: Feeling well Principal Problem: Major depression, recurrent episode remission Diagnosis:  Major depression, recurrent episode rem Today the patient is doing very well. She's here with her daughter,[name]. Patient is doing better in one of the reasons is because she has a new partner whose name is [name]. Patient says she sleeping and eating well. She's got good energy. Work is going well. She enjoys the choir and the holidays. She has a cat she enjoys TV and reads. Her energy level is good. She is a positive self-esteem.Patient has no evidence of psychosis. She drinks no alcohol uses no drugs. She works in Industrial/product designermedical office and enjoys it. The patient is not suicidal. She is functioning extremely well. She gets her daughter every other week for 1 week. Emotionally the patient is very stable. Past Medical History:  Diagnosis Date  . Diabetes mellitus without complication (HCC)   . Obesity   . Pre-diabetes     Past Surgical History:  Procedure Laterality Date  . BACK SURGERY  2004  . SPINAL FUSION  2004  . uterine ablation  2010   Family History:  Family History  Problem Relation Age of Onset  . Heart Problems Father   . Breast cancer Maternal Aunt   . Breast cancer Maternal Grandmother   . Breast cancer Maternal Aunt    Family Psychiatric  History:  Social History:  Social History   Substance and Sexual Activity  Alcohol Use No   Comment: Occasion mixed drink once a month     Social History   Substance and Sexual Activity  Drug Use No    Social History   Socioeconomic History  . Marital status: Single    Spouse name: None  . Number of children: None  . Years of education: None  . Highest education level: None  Social Needs  . Financial resource strain: None  . Food insecurity - worry: None  . Food insecurity - inability: None  . Transportation needs - medical: None  . Transportation  needs - non-medical: None  Occupational History  . None  Tobacco Use  . Smoking status: Never Smoker  . Smokeless tobacco: Never Used  Substance and Sexual Activity  . Alcohol use: No    Comment: Occasion mixed drink once a month  . Drug use: No  . Sexual activity: Not Currently  Other Topics Concern  . None  Social History Narrative  . None   Additional Social History:                         Sleep: Good  Appetite:  Good  Current Medications: Current Outpatient Medications  Medication Sig Dispense Refill  . buPROPion (WELLBUTRIN XL) 300 MG 24 hr tablet Take 1 tablet (300 mg total) by mouth daily. 30 tablet 5  . citalopram (CELEXA) 40 MG tablet TAKE 1 TABLET (40 MG TOTAL) BY MOUTH IN THE MORNING 30 tablet 6  . gabapentin (NEURONTIN) 100 MG capsule 2  tid 180 capsule 4  . Melatonin 5 MG LOZG Place 2 mg under the tongue at bedtime.    . metFORMIN (GLUCOPHAGE) 1000 MG tablet Take 1 tablet (1,000 mg total) by mouth 2 (two) times daily with a meal. 60 tablet 0  . PROAIR HFA 108 (90 Base) MCG/ACT inhaler Take 90 mcg by mouth 4 (four) times daily.  2  . traZODone (DESYREL) 50 MG tablet  Take 1-2 tablets at bedtime as needed for sleep 60 tablet 6   No current facility-administered medications for this visit.     Lab Results: No results found for this or any previous visit (from the past 48 hour(s)).  Blood Alcohol level:  Lab Results  Component Value Date   [facility]   ETH <11 06/20/2012    Physical Findings: AIMS:  , ,  ,  ,    CIWA:    COWS:     Musculoskeletal: Strength & Muscle Tone: within normal limits Gait & Station: normal Patient leans: N/A  Psychiatric Specialty Exam: ROS  Blood pressure 132/76, pulse 92, height 5\' 6"  (1.676 m), weight 274 lb (124.3 kg).Body mass index is 44.22 kg/m.  General Appearance: Casual  Eye Contact::  Good  Speech:  Clear and Coherent  Volume:  Normal  Mood:  Euthymic  Affect:  Congruent  Thought Process:   Coherent  Orientation:  Full (Time, Place, and Person)  Thought Content:  WDL  Suicidal Thoughts:  No  Homicidal Thoughts:  No  Memory:  NA  Judgement:  Good  Insight:  Fair  Psychomotor Activity:  Normal  Concentration:  Good  Recall:  Good  Fund of Knowledge:Good  Language: Good  Akathisia:  No  Handed:  Right  AIMS (if indicated):     Assets:  Desire for Improvement  ADL's:  Intact  Cognition: WNL  Sleep:      Treatment Plan Summary: At this time the patient is doing well taking Celexa 40 mg, Wellbutrin 300 mg and chooses to take trazodone 50 mg at night. She also takes melatonin. Her #1 problems therefore clinical major depression. She continues in one-to-one talking therapy with Darryl NestleMary Sue Alcott. Patient takes her medicines just as prescribed. The patient denies any physical complaints. She has no chest pain or shortness of breath. She is no neurological symptoms. At this time the patient is doing very well. She'll return to see me in 4 months for a 30 minute visit. Perhaps after the next visit she'll begin being seen for every 15 minutes sessions. Today's the second visit where she is doing well.

## 2017-03-10 ENCOUNTER — Telehealth (HOSPITAL_COMMUNITY): Payer: Self-pay

## 2017-03-10 DIAGNOSIS — F325 Major depressive disorder, single episode, in full remission: Secondary | ICD-10-CM

## 2017-03-10 MED ORDER — GABAPENTIN 100 MG PO CAPS: ORAL_CAPSULE | ORAL | 3 refills | Status: DC

## 2017-03-10 NOTE — Telephone Encounter (Signed)
Medication refill request - [facility] [address] for Gabapentin, last ordered 10/08/16 + 4 refills. Pt. seen 03/03/17 but no new order that date and returns next on 07/07/17.

## 2017-03-10 NOTE — Telephone Encounter (Signed)
Met with Dr. Plovsky regarding refill request for patient's Gabapentin and he provided a new verbal order for Gabapentin 100 mg, 2 thre#180 + 3 refills to last until patient returns to see him 07/07/17.  New order e-scribed as instructed by Dr. Plovsky [facility] [address].  

## 2017-07-06 LAB — HM PAP SMEAR

## 2017-07-07 ENCOUNTER — Encounter (HOSPITAL_COMMUNITY): Payer: Self-pay | Admitting: Psychiatry

## 2017-07-07 ENCOUNTER — Ambulatory Visit (INDEPENDENT_AMBULATORY_CARE_PROVIDER_SITE_OTHER): Payer: PRIVATE HEALTH INSURANCE | Admitting: Psychiatry

## 2017-07-07 VITALS — BP 117/68 | HR 68 | Ht 66.0 in | Wt 249.0 lb

## 2017-07-07 DIAGNOSIS — F3342 Major depressive disorder, recurrent, in full remission: Secondary | ICD-10-CM | POA: Diagnosis not present

## 2017-07-07 DIAGNOSIS — F320 Major depressive disorder, single episode, mild: Secondary | ICD-10-CM

## 2017-07-07 DIAGNOSIS — F325 Major depressive disorder, single episode, in full remission: Secondary | ICD-10-CM

## 2017-07-07 MED ORDER — BUPROPION HCL ER (XL) 300 MG PO TB24: 300.0000 mg | ORAL_TABLET | Freq: Every day | ORAL | 5 refills | Status: DC

## 2017-07-07 MED ORDER — CITALOPRAM HYDROBROMIDE 40 MG PO TABS: ORAL_TABLET | ORAL | 6 refills | Status: DC

## 2017-07-07 MED ORDER — GABAPENTIN 100 MG PO CAPS: ORAL_CAPSULE | ORAL | 3 refills | Status: DC

## 2017-07-07 NOTE — Progress Notes (Signed)
[facility]  07/07/2017 4:33 PM [name]  MRN:  [id number] Subjective: Feeling well Principal Problem: Major depression, recurrent episode remission Diagnosis:  Major depression, recurrent episode rem  Today the patient is seen alone. She says she doing well. She says she is looking for new job. Says she needs to make more money. She likes that she does she doesn't like the people she works with very much and clearly does not like her supervisor. Presently she is in a new relationship with a woman whose name is Herbalistrin. The patient's daughter is doing well. The patient drinks no alcohol uses no drugs. She is sleeping and eating well. On her own she discontinued her trazodone. The patient denies being suicidal. She has no psychosis. She's a good sense of worth. She has good energy levels. At this time she doing quite well. She is functioning very well. Today she asked the possibility of reducing some of her medicines. It should be noted his she's taking Celexa 40 mg it is recently went up to 300 mg of Wellbutrin. We will keep both of these medicines. The patient also is taking Neurontin 100 mg 2 of them 3 times a day. The patient is anxiety level is much controlled. She is no complaints at all today. She's very pleased with her care and she continues in therapy. Past Medical History:  Diagnosis Date  . Diabetes mellitus without complication (HCC)   . Obesity   . Pre-diabetes     Past Surgical History:  Procedure Laterality Date  . BACK SURGERY  2004  . SPINAL FUSION  2004  . uterine ablation  2010   Family History:  Family History  Problem Relation Age of Onset  . Heart Problems Father   . Breast cancer Maternal Aunt   . Breast cancer Maternal Grandmother   . Breast cancer Maternal Aunt    Family Psychiatric  History:  Social History:  Social History   Substance and Sexual Activity  Alcohol Use No   Comment: Occasion mixed drink once a month     Social History    Substance and Sexual Activity  Drug Use No    Social History   Socioeconomic History  . Marital status: Single    Spouse name: Not on file  . Number of children: Not on file  . Years of education: Not on file  . Highest education level: Not on file  Occupational History  . Not on file  Social Needs  . Financial resource strain: Not on file  . Food insecurity:    Worry: Not on file    Inability: Not on file  . Transportation needs:    Medical: Not on file    Non-medical: Not on file  Tobacco Use  . Smoking status: Never Smoker  . Smokeless tobacco: Never Used  Substance and Sexual Activity  . Alcohol use: No    Comment: Occasion mixed drink once a month  . Drug use: No  . Sexual activity: Not Currently  Lifestyle  . Physical activity:    Days per week: Not on file    Minutes per session: Not on file  . Stress: Not on file  Relationships  . Social connections:    Talks on phone: Not on file    Gets together: Not on file    Attends religious service: Not on file    Active member of club or organization: Not on file    Attends meetings of clubs or  organizations: Not on file    Relationship status: Not on file  Other Topics Concern  . Not on file  Social History Narrative  . Not on file   Additional Social History:                         Sleep: Good  Appetite:  Good  Current Medications: Current Outpatient Medications  Medication Sig Dispense Refill  . buPROPion (WELLBUTRIN XL) 300 MG 24 hr tablet Take 1 tablet (300 mg total) by mouth daily. 30 tablet 5  . citalopram (CELEXA) 40 MG tablet TAKE 1 TABLET (40 MG TOTAL) BY MOUTH IN THE MORNING 30 tablet 6  . gabapentin (NEURONTIN) 100 MG capsule 2   Tid  For  2 weeks then 1  bid 180 capsule 3  . PROAIR HFA 108 (90 Base) MCG/ACT inhaler Take 90 mcg by mouth 4 (four) times daily.  2  . Melatonin 5 MG LOZG Place 2 mg under the tongue at bedtime.    . metFORMIN (GLUCOPHAGE) 1000 MG tablet Take 1 tablet  (1,000 mg total) by mouth 2 (two) times daily with a meal. (Patient not taking: Reported on 07/07/2017) 60 tablet 0  . traZODone (DESYREL) 50 MG tablet Take 1-2 tablets at bedtime as needed for sleep (Patient not taking: Reported on 07/07/2017) 60 tablet 6   No current facility-administered medications for this visit.     Lab Results: No results found for this or any previous visit (from the past 48 hour(s)).  Blood Alcohol level:  Lab Results  Component Value Date   [facility] <5 03/03/2016   ETH <11 06/20/2012    Physical Findings: AIMS:  , ,  ,  ,    CIWA:    COWS:     Musculoskeletal: Strength & Muscle Tone: within normal limits Gait & Station: normal Patient leans: N/A  Psychiatric Specialty Exam: ROS  Blood pressure 117/68, pulse 68, height 5\' 6"  (1.676 m), weight 249 lb (112.9 kg), SpO2 97 %.Body mass index is 40.19 kg/m.  General Appearance: Casual  Eye Contact::  Good  Speech:  Clear and Coherent  Volume:  Normal  Mood:  Euthymic  Affect:  Congruent  Thought Process:  Coherent  Orientation:  Full (Time, Place, and Person)  Thought Content:  WDL  Suicidal Thoughts:  No  Homicidal Thoughts:  No  Memory:  NA  Judgement:  Good  Insight:  Fair  Psychomotor Activity:  Normal  Concentration:  Good  Recall:  Good  Fund of Knowledge:Good  Language: Good  Akathisia:  No  Handed:  Right  AIMS (if indicated):     Assets:  Desire for Improvement  ADL's:  Intact  Cognition: WNL  Sleep:      Treatment Plan Summary: At this time the patient will reduce her Neurontin to taking 100 mg 3 times a day for 2 weeks and then reduce to 200 mg twice a day. She'll return to see me in 2 months and at that time she is well we'll discontinue her Neurontin. We'll likely continue her Celexa and Wellbutrin at present dose is for long time. The patient will continue in therapy with Merla Riches. Patient is positive and optimistic. She denies being suicidal. She is functioning very well. She  is looking for new opportunity to make more money.

## 2017-09-14 ENCOUNTER — Encounter (HOSPITAL_COMMUNITY): Payer: Self-pay | Admitting: Psychiatry

## 2017-09-14 ENCOUNTER — Ambulatory Visit (INDEPENDENT_AMBULATORY_CARE_PROVIDER_SITE_OTHER): Payer: PRIVATE HEALTH INSURANCE | Admitting: Psychiatry

## 2017-09-14 VITALS — BP 127/76 | HR 80 | Ht 66.0 in | Wt 243.0 lb

## 2017-09-14 DIAGNOSIS — F3342 Major depressive disorder, recurrent, in full remission: Secondary | ICD-10-CM | POA: Diagnosis not present

## 2017-09-14 NOTE — Progress Notes (Signed)
[facility]  09/14/2017 4:14 PM [name]  MRN:  [id number] Subjective: Feeling well Principal Problem: Major depression, recurrent episode remission Diagnosis:  Major depression, recurrent episode rem  Patient is doing very well. She's had a new job. She hasn't started yet she still has to go to the paperwork but it looks like the job the patient continues in her relationship partner Erein. Her daughter is entering fifth grade and seems to be doing well. The patient is sleeping and eating well. She's got good energy. Positive and optimistic. She drinks no alcohol. He uses no drugs. Her anxiety level is well-controlled despite the fact that we turned down her Neurontin. Patient has no evidence of psychosis. Her thinking is clear and organized. Patient is doing very well. Today she was asked to return to see how she's doing a lower dose of Neurontin and likely reduce it further Past Medical History:  Diagnosis Date  . Diabetes mellitus without complication (HCC)   . Obesity   . Pre-diabetes     Past Surgical History:  Procedure Laterality Date  . BACK SURGERY  2004  . SPINAL FUSION  2004  . uterine ablation  2010   Family History:  Family History  Problem Relation Age of Onset  . Heart Problems Father   . Breast cancer Maternal Aunt   . Breast cancer Maternal Grandmother   . Breast cancer Maternal Aunt    Family Psychiatric  History:  Social History:  Social History   Substance and Sexual Activity  Alcohol Use No   Comment: Occasion mixed drink once a month     Social History   Substance and Sexual Activity  Drug Use No    Social History   Socioeconomic History  . Marital status: Single    Spouse name: Not on file  . Number of children: Not on file  . Years of education: Not on file  . Highest education level: Not on file  Occupational History  . Not on file  Social Needs  . Financial resource strain: Not on file  . Food insecurity:    Worry: Not  on file    Inability: Not on file  . Transportation needs:    Medical: Not on file    Non-medical: Not on file  Tobacco Use  . Smoking status: Never Smoker  . Smokeless tobacco: Never Used  Substance and Sexual Activity  . Alcohol use: No    Comment: Occasion mixed drink once a month  . Drug use: No  . Sexual activity: Not Currently  Lifestyle  . Physical activity:    Days per week: Not on file    Minutes per session: Not on file  . Stress: Not on file  Relationships  . Social connections:    Talks on phone: Not on file    Gets together: Not on file    Attends religious service: Not on file    Active member of club or organization: Not on file    Attends meetings of clubs or organizations: Not on file    Relationship status: Not on file  Other Topics Concern  . Not on file  Social History Narrative  . Not on file   Additional Social History:                         Sleep: Good  Appetite:  Good  Current Medications: Current Outpatient Medications  Medication Sig Dispense Refill  .  buPROPion (WELLBUTRIN XL) 300 MG 24 hr tablet Take 1 tablet (300 mg total) by mouth daily. 30 tablet 5  . citalopram (CELEXA) 40 MG tablet TAKE 1 TABLET (40 MG TOTAL) BY MOUTH IN THE MORNING 30 tablet 6  . gabapentin (NEURONTIN) 100 MG capsule 2   Tid  For  2 weeks then 1  bid 180 capsule 3  . metFORMIN (GLUCOPHAGE) 1000 MG tablet Take 1 tablet (1,000 mg total) by mouth 2 (two) times daily with a meal. 60 tablet 0  . PROAIR HFA 108 (90 Base) MCG/ACT inhaler Take 90 mcg by mouth 4 (four) times daily.  2  . Melatonin 5 MG LOZG Place 2 mg under the tongue at bedtime.    . traZODone (DESYREL) 50 MG tablet Take 1-2 tablets at bedtime as needed for sleep (Patient not taking: Reported on 07/07/2017) 60 tablet 6   No current facility-administered medications for this visit.     Lab Results: No results found for this or any previous visit (from the past 48 hour(s)).  Blood Alcohol level:   Lab Results  Component Value Date   [facility] <5 03/03/2016   ETH <11 06/20/2012    Physical Findings: AIMS:  , ,  ,  ,    CIWA:    COWS:     Musculoskeletal: Strength & Muscle Tone: within normal limits Gait & Station: normal Patient leans: N/A  Psychiatric Specialty Exam: ROS  Blood pressure 127/76, pulse 80, height 5\' 6"  (1.676 m), weight 243 lb (110.2 kg), SpO2 97 %.Body mass index is 39.22 kg/m.  General Appearance: Casual  Eye Contact::  Good  Speech:  Clear and Coherent  Volume:  Normal  Mood:  Euthymic  Affect:  Congruent  Thought Process:  Coherent  Orientation:  Full (Time, Place, and Person)  Thought Content:  WDL  Suicidal Thoughts:  No  Homicidal Thoughts:  No  Memory:  NA  Judgement:  Good  Insight:  Fair  Psychomotor Activity:  Normal  Concentration:  Good  Recall:  Good  Fund of Knowledge:Good  Language: Good  Akathisia:  No  Handed:  Right  AIMS (if indicated):     Assets:  Desire for Improvement  ADL's:  Intact  Cognition: WNL  Sleep:      Treatment Plan Summary: At this time we will go ahead and reduce her Neurontin further. She refuses 100 mg 1 twice a day we'll go slow and she's under job. She will return to see me in 4 months. Is likely going to be anxiety. Time she sees me she'll be able to answer some depression job determine if she likes. The biggest reason she change was due to financial reasons the patient lives with patient more. When she returns in 4 months we'll consider reducing the Neurontin further probably discontinue herfor now she'll continue taking Celexa 40 mg and Wellbutrin 300 mg's. Today he spent more than 50% of the time talking about her medications and how it relates to her disorder the patient this time is very stable functioning very well.Marland Kitchen

## 2017-11-01 DIAGNOSIS — F4310 Post-traumatic stress disorder, unspecified: Secondary | ICD-10-CM | POA: Diagnosis not present

## 2017-11-15 DIAGNOSIS — F4310 Post-traumatic stress disorder, unspecified: Secondary | ICD-10-CM | POA: Diagnosis not present

## 2017-11-18 ENCOUNTER — Telehealth: Payer: Self-pay | Admitting: Internal Medicine

## 2017-11-18 NOTE — Telephone Encounter (Signed)
Copied from CRM [id number]. Topic: Appointment Scheduling - Scheduling Inquiry for Clinic >> Nov 18, 2017 12:09 PM Floria RavelingStovall, [name] wrote: Reason for CRM:   Pt works for [employer] and Dr Okey Duprerawford was highly recommend to her and would like to know if Dr Okey Duprerawford would be willing to take her on as Johnson new pt?    Best contact number  [phone]   Dr.Crawford are you okay with this?

## 2017-11-19 NOTE — Telephone Encounter (Signed)
Appointment has been made for 10/10 - New patient forms set out. \  Tammy can you changes this in schedule to np?

## 2017-11-19 NOTE — Telephone Encounter (Signed)
LVM to inform patient Dr.Crawford would accept her as a new patient & to please call the office back to set up this appointment.   Make the type "Office visit" and in the comment "New patient /Insurance", once completed - let us know when the appointment is and we will override and change the type to New Patient.

## 2017-11-19 NOTE — Telephone Encounter (Signed)
Fine to schedule 

## 2017-11-29 DIAGNOSIS — F4310 Post-traumatic stress disorder, unspecified: Secondary | ICD-10-CM | POA: Diagnosis not present

## 2017-12-13 DIAGNOSIS — F4310 Post-traumatic stress disorder, unspecified: Secondary | ICD-10-CM | POA: Diagnosis not present

## 2017-12-27 DIAGNOSIS — F4310 Post-traumatic stress disorder, unspecified: Secondary | ICD-10-CM | POA: Diagnosis not present

## 2017-12-30 ENCOUNTER — Ambulatory Visit: Payer: 59 | Admitting: Internal Medicine

## 2017-12-30 ENCOUNTER — Other Ambulatory Visit (INDEPENDENT_AMBULATORY_CARE_PROVIDER_SITE_OTHER): Payer: 59

## 2017-12-30 ENCOUNTER — Encounter: Payer: Self-pay | Admitting: Internal Medicine

## 2017-12-30 VITALS — BP 116/84 | HR 81 | Temp 98.8°F | Ht 66.0 in | Wt 233.0 lb

## 2017-12-30 DIAGNOSIS — Z803 Family history of malignant neoplasm of breast: Secondary | ICD-10-CM

## 2017-12-30 DIAGNOSIS — Z0000 Encounter for general adult medical examination without abnormal findings: Secondary | ICD-10-CM

## 2017-12-30 LAB — COMPREHENSIVE METABOLIC PANEL
ALK PHOS: 56 U/L (ref 39–117)
ALT: 13 U/L (ref 0–35)
AST: 11 U/L (ref 0–37)
Albumin: 3.8 g/dL (ref 3.5–5.2)
BILIRUBIN TOTAL: 0.6 mg/dL (ref 0.2–1.2)
BUN: 17 mg/dL (ref 6–23)
CO2: 25 meq/L (ref 19–32)
Calcium: 9.1 mg/dL (ref 8.4–10.5)
Chloride: 105 mEq/L (ref 96–112)
Creatinine, Ser: 0.75 mg/dL (ref 0.40–1.20)
GFR: 90.87 mL/min (ref >60.00)
GLUCOSE: 175 mg/dL — AB (ref 70–99)
Potassium: 4.1 mEq/L (ref 3.5–5.1)
SODIUM: 138 meq/L (ref 135–145)
TOTAL PROTEIN: 7 g/dL (ref 6.0–8.3)

## 2017-12-30 LAB — CBC
HCT: 41.9 % (ref 36.0–46.0)
Hemoglobin: 14 g/dL (ref 12.0–15.0)
MCHC: 33.4 g/dL (ref 30.0–36.0)
MCV: 89.7 fl (ref 78.0–100.0)
Platelets: 331 10*3/uL (ref 150.0–400.0)
RBC: 4.67 Mil/uL (ref 3.87–5.11)
RDW: 12.9 % (ref 11.5–15.5)
WBC: 7.4 10*3/uL (ref 4.0–10.5)

## 2017-12-30 LAB — LIPID PANEL
CHOL/HDL RATIO: 3
Cholesterol: 155 mg/dL (ref 0–200)
HDL: 45 mg/dL (ref >39.00)
LDL Cholesterol: 96 mg/dL (ref 0–99)
NONHDL: 110.16
Triglycerides: 69 mg/dL (ref 0.0–149.0)
VLDL: 13.8 mg/dL (ref 0.0–40.0)

## 2017-12-30 LAB — HEMOGLOBIN A1C: Hgb A1c MFr Bld: 7.1 % — ABNORMAL HIGH (ref 4.6–6.5)

## 2017-12-30 NOTE — Progress Notes (Signed)
   Subjective:    Patient ID: [name], female    DOB: 1975-07-21, 42 y.o.   MRN: [id number]  HPI The patient is a new 42 YO female coming in for physical. She has some PTSD from upbringing and this is under good control currently seeing psych for medication management and counseling every 2 weeks. Recent divorce and intentional weight loss in the last year to make positive changes. 69 year old daughter.   PMH, [facility], social history reviewed and updated.   Review of Systems  Constitutional: Negative.   HENT: Negative.   Eyes: Negative.   Respiratory: Negative for cough, chest tightness and shortness of breath.   Cardiovascular: Negative for chest pain, palpitations and leg swelling.  Gastrointestinal: Negative for abdominal distention, abdominal pain, constipation, diarrhea, nausea and vomiting.  Musculoskeletal: Negative.   Skin: Negative.   Neurological: Negative.   Psychiatric/Behavioral: Negative.       Objective:   Physical Exam  Constitutional: She is oriented to person, place, and time. She appears well-developed and well-nourished.  HENT:  Head: Normocephalic and atraumatic.  Eyes: EOM are normal.  Neck: Normal range of motion.  Cardiovascular: Normal rate and regular rhythm.  Pulmonary/Chest: Effort normal and breath sounds normal. No respiratory distress. She has no wheezes. She has no rales.  Abdominal: Soft. Bowel sounds are normal. She exhibits no distension. There is no tenderness. There is no rebound.  Musculoskeletal: She exhibits no edema.  Neurological: She is alert and oriented to person, place, and time. Coordination normal.  Skin: Skin is warm and dry.  Psychiatric: She has a normal mood and affect.   Vitals:   12/30/17 0804  BP: 116/84  Pulse: 81  Temp: 98.8 F (37.1 C)  TempSrc: Oral  SpO2: 99%  Weight: 233 lb (105.7 kg)  Height: 5\' 6"  (1.676 m)      Assessment & Plan:

## 2017-12-30 NOTE — Assessment & Plan Note (Signed)
Gets yearly mammogram and breast exam with gyn.

## 2017-12-30 NOTE — Assessment & Plan Note (Signed)
Pap smear and mammogram with gyn and up to date. Tetanus up to date. Flu shot up to date. Counseled about sun safety and mole surveillance. Given screening recommendations.

## 2017-12-30 NOTE — Patient Instructions (Signed)

## 2018-01-10 DIAGNOSIS — F4310 Post-traumatic stress disorder, unspecified: Secondary | ICD-10-CM | POA: Diagnosis not present

## 2018-01-13 ENCOUNTER — Ambulatory Visit (HOSPITAL_COMMUNITY): Payer: Self-pay | Admitting: Psychiatry

## 2018-01-24 DIAGNOSIS — F4310 Post-traumatic stress disorder, unspecified: Secondary | ICD-10-CM | POA: Diagnosis not present

## 2018-02-07 DIAGNOSIS — F4310 Post-traumatic stress disorder, unspecified: Secondary | ICD-10-CM | POA: Diagnosis not present

## 2018-02-21 ENCOUNTER — Ambulatory Visit (INDEPENDENT_AMBULATORY_CARE_PROVIDER_SITE_OTHER): Payer: 59 | Admitting: Internal Medicine

## 2018-02-21 DIAGNOSIS — F411 Generalized anxiety disorder: Secondary | ICD-10-CM | POA: Diagnosis not present

## 2018-02-21 NOTE — Progress Notes (Signed)
Abstracted and sent to scan  

## 2018-02-22 ENCOUNTER — Telehealth: Payer: Self-pay

## 2018-02-22 NOTE — Telephone Encounter (Signed)
Abstracted pap results and sent to scan

## 2018-03-07 DIAGNOSIS — F411 Generalized anxiety disorder: Secondary | ICD-10-CM | POA: Diagnosis not present

## 2018-03-15 ENCOUNTER — Encounter: Payer: Self-pay | Admitting: Internal Medicine

## 2018-03-24 DIAGNOSIS — F411 Generalized anxiety disorder: Secondary | ICD-10-CM | POA: Diagnosis not present

## 2018-03-30 ENCOUNTER — Ambulatory Visit (INDEPENDENT_AMBULATORY_CARE_PROVIDER_SITE_OTHER): Payer: 59 | Admitting: Psychiatry

## 2018-03-30 ENCOUNTER — Encounter (HOSPITAL_COMMUNITY): Payer: Self-pay | Admitting: Psychiatry

## 2018-03-30 VITALS — BP 128/72 | Ht 66.0 in | Wt 233.0 lb

## 2018-03-30 DIAGNOSIS — F320 Major depressive disorder, single episode, mild: Secondary | ICD-10-CM

## 2018-03-30 DIAGNOSIS — F3341 Major depressive disorder, recurrent, in partial remission: Secondary | ICD-10-CM

## 2018-03-30 MED ORDER — CITALOPRAM HYDROBROMIDE 40 MG PO TABS: ORAL_TABLET | ORAL | 6 refills | Status: DC

## 2018-03-30 MED FILL — CITALOPRAM HBR 40 MG TABLET: 40 | 30 days supply | Qty: 30 | Fill #0

## 2018-03-30 NOTE — Progress Notes (Signed)
[facility] MD Progress Note  03/30/2018 4:22 PM [name]  MRN:  [id number] Subjective: Feeling well Principal Problem: Major depression, recurrent episode remission Diagnosis:  Major depression, recurrent episode rem  This patient is seen in his office for the treatment of major depression.  Since I have seen this patient she actually stopped on her own all her antidepressants.  She did not feel much difference until about a month or 2 later when she started experiencing increased irritability and dysphoric mood state.  She noted that she was waking up in the middle of night, eating excessively and had a reduction in her energy.  She difficulty thinking and concentrating.  Fortunately she was not suicidal.  The patient drinks no alcohol and uses no drugs.  The patient is working through her relationship with her partner.  Patient is a 41 year old daughter who actually is doing well.  The patient is a new job for the last 8 months and is doing fairly well.  The patient has diabetes but she is done well losing weight now is off all medications.  It is noted that her hemoglobin A1c is 7.1.  Patient has no evidence of psychosis.  At this time the patient is sleeping and eating well.  She herself went back to Celexa taking only 20 mg.  She says it has helped.  Her mood is improved.  She is sleeping and eating better.  The patient never went back to Wellbutrin.  And she never took Neurontin.  Overall the patient's energy level at this time is good.  She is functioning well.  Despite the fact that she has diabetes fortunately she is not had any cardiovascular events.  She has no chest pain or shortness of breath.  She denies fatigue. Past Medical History:  Diagnosis Date  . Anxiety   . Asthma   . Depression   . Diabetes mellitus without complication (HCC)   . Obesity   . Pre-diabetes   . PTSD (post-traumatic stress disorder)     Past Surgical History:  Procedure Laterality Date  . BACK SURGERY  2004   . SPINAL FUSION  2004  . uterine ablation  2010   Family History:  Family History  Problem Relation Age of Onset  . Heart Problems Father   . Breast cancer Maternal Aunt   . Breast cancer Maternal Grandmother   . Breast cancer Maternal Aunt    Family Psychiatric  History:  Social History:  Social History   Substance and Sexual Activity  Alcohol Use No   Comment: Occasion mixed drink once a month     Social History   Substance and Sexual Activity  Drug Use No    Social History   Socioeconomic History  . Marital status: Divorced    Spouse name: Not on file  . Number of children: Not on file  . Years of education: Not on file  . Highest education level: Not on file  Occupational History  . Not on file  Social Needs  . Financial resource strain: Not on file  . Food insecurity:    Worry: Not on file    Inability: Not on file  . Transportation needs:    Medical: Not on file    Non-medical: Not on file  Tobacco Use  . Smoking status: Never Smoker  . Smokeless tobacco: Never Used  Substance and Sexual Activity  . Alcohol use: No    Comment: Occasion mixed drink once a month  . Drug use:  No  . Sexual activity: Not Currently  Lifestyle  . Physical activity:    Days per week: Not on file    Minutes per session: Not on file  . Stress: Not on file  Relationships  . Social connections:    Talks on phone: Not on file    Gets together: Not on file    Attends religious service: Not on file    Active member of club or organization: Not on file    Attends meetings of clubs or organizations: Not on file    Relationship status: Not on file  Other Topics Concern  . Not on file  Social History Narrative  . Not on file   Additional Social History:                         Sleep: Good  Appetite:  Good  Current Medications: Current Outpatient Medications  Medication Sig Dispense Refill  . citalopram (CELEXA) 40 MG tablet TAKE 1 TABLET (40 MG TOTAL) BY  MOUTH IN THE MORNING 30 tablet 6  . PROAIR HFA 108 (90 Base) MCG/ACT inhaler Take 90 mcg by mouth 4 (four) times daily.  2  . buPROPion (WELLBUTRIN XL) 300 MG 24 hr tablet Take 1 tablet (300 mg total) by mouth daily. (Patient not taking: Reported on 03/30/2018) 30 tablet 5   No current facility-administered medications for this visit.     Lab Results: No results found for this or any previous visit (from the past 48 hour(s)).  Blood Alcohol level:  Lab Results  Component Value Date   [facility] <5 03/03/2016   ETH <11 06/20/2012    Physical Findings: AIMS:  , ,  ,  ,    CIWA:    COWS:     Musculoskeletal: Strength & Muscle Tone: within normal limits Gait & Station: normal Patient leans: N/A  Psychiatric Specialty Exam: ROS  Blood pressure 128/72, height 5\' 6"  (1.676 m), weight 233 lb (105.7 kg).Body mass index is 37.61 kg/m.  General Appearance: Casual  Eye Contact::  Good  Speech:  Clear and Coherent  Volume:  Normal  Mood:  Euthymic  Affect:  Congruent  Thought Process:  Coherent  Orientation:  Full (Time, Place, and Person)  Thought Content:  WDL  Suicidal Thoughts:  No  Homicidal Thoughts:  No  Memory:  NA  Judgement:  Good  Insight:  Fair  Psychomotor Activity:  Normal  Concentration:  Good  Recall:  Good  Fund of Knowledge:Good  Language: Good  Akathisia:  No  Handed:  Right  AIMS (if indicated):     Assets:  Desire for Improvement  ADL's:  Intact  Cognition: WNL  Sleep:      Treatment Plan Summary: This patient has only 1 psychiatric problem that is major depression.  It apparently is recurrent in its nature.  He clearly needs to be on medication.  Today our plan is for her to go back to the full dose of Celexa 40 mg but will hold off on the Wellbutrin for now.  She will continue in psychotherapy with Dagoberto Reef.  So our treatment plan is for her to get back to Celexa hold off Wellbutrin and continue in psychotherapy.  The patient is not suicidal.  He is  actually functioning very well.  She will return to see me in 7 weeks and at that time we will consider going back to Wellbutrin in addition to the Celexa.

## 2018-04-04 DIAGNOSIS — F411 Generalized anxiety disorder: Secondary | ICD-10-CM | POA: Diagnosis not present

## 2018-04-06 ENCOUNTER — Encounter: Payer: Self-pay | Admitting: Internal Medicine

## 2018-04-18 DIAGNOSIS — F411 Generalized anxiety disorder: Secondary | ICD-10-CM | POA: Diagnosis not present

## 2018-05-02 DIAGNOSIS — F411 Generalized anxiety disorder: Secondary | ICD-10-CM | POA: Diagnosis not present

## 2018-05-16 DIAGNOSIS — F411 Generalized anxiety disorder: Secondary | ICD-10-CM | POA: Diagnosis not present

## 2018-05-19 MED FILL — CITALOPRAM HBR 40 MG TABLET: 40 | 30 days supply | Qty: 30 | Fill #1

## 2018-06-08 ENCOUNTER — Ambulatory Visit (INDEPENDENT_AMBULATORY_CARE_PROVIDER_SITE_OTHER): Payer: 59 | Admitting: Psychiatry

## 2018-06-08 ENCOUNTER — Other Ambulatory Visit: Payer: Self-pay

## 2018-06-08 DIAGNOSIS — F325 Major depressive disorder, single episode, in full remission: Secondary | ICD-10-CM

## 2018-06-08 DIAGNOSIS — F320 Major depressive disorder, single episode, mild: Secondary | ICD-10-CM | POA: Diagnosis not present

## 2018-06-08 MED ORDER — CITALOPRAM HYDROBROMIDE 40 MG PO TABS: ORAL_TABLET | ORAL | 6 refills | Status: DC

## 2018-06-08 NOTE — Progress Notes (Signed)
[facility] MD Progress Note  06/08/2018 4:17 PM [name]  MRN:  [id number] Subjective: Feeling well Principal Problem: Major depression, recurrent episode remission Diagnosis:  Major depression, recurrent episode rem  Today the patient is doing well.  She likes her job.  She is been in a new relationship for over a year.  She cares a great deal about her.  Her 41 year old daughter is in fifth grade and is actually doing well.  The patient now starting to work from home.  She likes her job.  Patient is sleeping and eating well.  Her energy level is good.  Patient is not exercising as much as she should.  Her anxiety level is good.  She is now very consistent with taking Celexa 40 mg.  Seems to help her mood a great deal.  The patient is a positive position.  She denies being suicidal.  She is functioning extremely well. Past Surgical History:  Procedure Laterality Date  . BACK SURGERY  2004  . SPINAL FUSION  2004  . uterine ablation  2010   Family History:  Family History  Problem Relation Age of Onset  . Heart Problems Father   . Breast cancer Maternal Aunt   . Breast cancer Maternal Grandmother   . Breast cancer Maternal Aunt    Family Psychiatric  History:  Social History:  Social History   Substance and Sexual Activity  Alcohol Use No   Comment: Occasion mixed drink once a month     Social History   Substance and Sexual Activity  Drug Use No    Social History   Socioeconomic History  . Marital status: Divorced    Spouse name: Not on file  . Number of children: Not on file  . Years of education: Not on file  . Highest education level: Not on file  Occupational History  . Not on file  Social Needs  . Financial resource strain: Not on file  . Food insecurity:    Worry: Not on file    Inability: Not on file  . Transportation needs:    Medical: Not on file    Non-medical: Not on file  Tobacco Use  . Smoking status: Never Smoker  . Smokeless tobacco: Never Used   Substance and Sexual Activity  . Alcohol use: No    Comment: Occasion mixed drink once a month  . Drug use: No  . Sexual activity: Not Currently  Lifestyle  . Physical activity:    Days per week: Not on file    Minutes per session: Not on file  . Stress: Not on file  Relationships  . Social connections:    Talks on phone: Not on file    Gets together: Not on file    Attends religious service: Not on file    Active member of club or organization: Not on file    Attends meetings of clubs or organizations: Not on file    Relationship status: Not on file  Other Topics Concern  . Not on file  Social History Narrative  . Not on file   Additional Social History:                         Sleep: Good  Appetite:  Good  Current Medications: Current Outpatient Medications  Medication Sig Dispense Refill  . citalopram (CELEXA) 40 MG tablet TAKE 1 TABLET (40 MG TOTAL) BY MOUTH IN THE MORNING 30 tablet 6  . PROAIR  HFA 108 (90 Base) MCG/ACT inhaler Take 90 mcg by mouth 4 (four) times daily.  2   No current facility-administered medications for this visit.     Lab Results: No results found for this or any previous visit (from the past 48 hour(s)).  Blood Alcohol level:  Lab Results  Component Value Date   [facility] <5 03/03/2016   ETH <11 06/20/2012    Physical Findings: AIMS:  , ,  ,  ,    CIWA:    COWS:     Musculoskeletal: Strength & Muscle Tone: within normal limits Gait & Station: normal Patient leans: N/A  Psychiatric Specialty Exam: ROS  There were no vitals taken for this visit.There is no height or weight on file to calculate BMI.  General Appearance: Casual  Eye Contact::  Good  Speech:  Clear and Coherent  Volume:  Normal  Mood:  Euthymic  Affect:  Congruent  Thought Process:  Coherent  Orientation:  Full (Time, Place, and Person)  Thought Content:  WDL  Suicidal Thoughts:  No  Homicidal Thoughts:  No  Memory:  NA  Judgement:  Good  Insight:   Fair  Psychomotor Activity:  Normal  Concentration:  Good  Recall:  Good  Fund of Knowledge:Good  Language: Good  Akathisia:  No  Handed:  Right  AIMS (if indicated):     Assets:  Desire for Improvement  ADL's:  Intact  Cognition: WNL  Sleep:      Treatment Plan Summary: The patient is #1 and only problem is that of major depression.  She takes Celexa 40 mg and does well.  She has no vegetative symptoms at this time.  She is enjoying life.  She certainly is not suicidal.  She will return to see me in 6 months.

## 2018-06-13 DIAGNOSIS — F411 Generalized anxiety disorder: Secondary | ICD-10-CM | POA: Diagnosis not present

## 2018-06-18 MED FILL — CITALOPRAM HBR 40 MG TABLET: 40 | 30 days supply | Qty: 30 | Fill #0

## 2018-06-27 DIAGNOSIS — F411 Generalized anxiety disorder: Secondary | ICD-10-CM | POA: Diagnosis not present

## 2018-07-11 DIAGNOSIS — F411 Generalized anxiety disorder: Secondary | ICD-10-CM | POA: Diagnosis not present

## 2018-07-18 MED FILL — CITALOPRAM HBR 40 MG TABLET: 40 | 30 days supply | Qty: 30 | Fill #1

## 2018-07-19 DIAGNOSIS — F411 Generalized anxiety disorder: Secondary | ICD-10-CM | POA: Diagnosis not present

## 2018-07-25 DIAGNOSIS — F411 Generalized anxiety disorder: Secondary | ICD-10-CM | POA: Diagnosis not present

## 2018-08-08 DIAGNOSIS — F411 Generalized anxiety disorder: Secondary | ICD-10-CM | POA: Diagnosis not present

## 2018-08-22 DIAGNOSIS — F411 Generalized anxiety disorder: Secondary | ICD-10-CM | POA: Diagnosis not present

## 2018-08-30 DIAGNOSIS — F411 Generalized anxiety disorder: Secondary | ICD-10-CM | POA: Diagnosis not present

## 2018-09-05 DIAGNOSIS — F411 Generalized anxiety disorder: Secondary | ICD-10-CM | POA: Diagnosis not present

## 2018-09-13 DIAGNOSIS — F411 Generalized anxiety disorder: Secondary | ICD-10-CM | POA: Diagnosis not present

## 2018-10-10 DIAGNOSIS — F411 Generalized anxiety disorder: Secondary | ICD-10-CM | POA: Diagnosis not present

## 2018-10-17 DIAGNOSIS — F411 Generalized anxiety disorder: Secondary | ICD-10-CM | POA: Diagnosis not present

## 2018-10-28 DIAGNOSIS — E119 Type 2 diabetes mellitus without complications: Secondary | ICD-10-CM | POA: Diagnosis not present

## 2018-10-28 LAB — HM DIABETES EYE EXAM

## 2018-10-31 DIAGNOSIS — F411 Generalized anxiety disorder: Secondary | ICD-10-CM | POA: Diagnosis not present

## 2018-11-14 DIAGNOSIS — F411 Generalized anxiety disorder: Secondary | ICD-10-CM | POA: Diagnosis not present

## 2018-11-29 DIAGNOSIS — F411 Generalized anxiety disorder: Secondary | ICD-10-CM | POA: Diagnosis not present

## 2018-12-08 ENCOUNTER — Ambulatory Visit (HOSPITAL_COMMUNITY): Payer: 59 | Admitting: Psychiatry

## 2018-12-08 DIAGNOSIS — F411 Generalized anxiety disorder: Secondary | ICD-10-CM | POA: Diagnosis not present

## 2018-12-23 ENCOUNTER — Other Ambulatory Visit: Payer: Self-pay

## 2018-12-23 ENCOUNTER — Ambulatory Visit (INDEPENDENT_AMBULATORY_CARE_PROVIDER_SITE_OTHER): Payer: 59 | Admitting: Psychiatry

## 2018-12-23 DIAGNOSIS — F320 Major depressive disorder, single episode, mild: Secondary | ICD-10-CM

## 2018-12-23 DIAGNOSIS — F325 Major depressive disorder, single episode, in full remission: Secondary | ICD-10-CM

## 2018-12-23 MED ORDER — CITALOPRAM HYDROBROMIDE 40 MG PO TABS: ORAL_TABLET | ORAL | 6 refills | Status: DC

## 2018-12-23 NOTE — Progress Notes (Signed)
[facility] MD Progress Note  12/23/2018 9:30 AM [name]  MRN:  [id number] Subjective: Feeling well Principal Problem: Major depression, recurrent episode remission Diagnosis:  Major depression, recurrent episode rem  Today the patient is doing fairly well.  She works from home.  She is doing okay at work.  Her daughter who is 41 years old is doing well and is at home for school.  The patient is in a fairly good relationship which seems to be stable.  The patient admits she has some mild increasing depression and anxiety but attributes to the pandemic.  She is also gained a little weight from it.  The patient is sleeping fairly well.  She is got good energy.  She is in good therapy.  She knows she should take care of herself.  She still gets enjoyment out of things.  She is not suicidal.  She is functioning fairly well.  She denies any use of alcohol or drugs.  She has no shortness of breath or cough or fever.  Physically she is quite stable.  The patient takes her Celexa on a regular basis.  At this time she is pleased with the fact and is aware that she is having stress related mainly to the effects of the pandemic.  The isolation is somewhat difficult for her.  Nonetheless she seems to be coping well. Past Surgical History:  Procedure Laterality Date  . BACK SURGERY  2004  . SPINAL FUSION  2004  . uterine ablation  2010   Family History:  Family History  Problem Relation Age of Onset  . Heart Problems Father   . Breast cancer Maternal Aunt   . Breast cancer Maternal Grandmother   . Breast cancer Maternal Aunt    Family Psychiatric  History:  Social History:  Social History   Substance and Sexual Activity  Alcohol Use No   Comment: Occasion mixed drink once a month     Social History   Substance and Sexual Activity  Drug Use No    Social History   Socioeconomic History  . Marital status: Divorced    Spouse name: Not on file  . Number of children: Not on file  . Years of  education: Not on file  . Highest education level: Not on file  Occupational History  . Not on file  Social Needs  . Financial resource strain: Not on file  . Food insecurity    Worry: Not on file    Inability: Not on file  . Transportation needs    Medical: Not on file    Non-medical: Not on file  Tobacco Use  . Smoking status: Never Smoker  . Smokeless tobacco: Never Used  Substance and Sexual Activity  . Alcohol use: No    Comment: Occasion mixed drink once a month  . Drug use: No  . Sexual activity: Not Currently  Lifestyle  . Physical activity    Days per week: Not on file    Minutes per session: Not on file  . Stress: Not on file  Relationships  . Social Musician on phone: Not on file    Gets together: Not on file    Attends religious service: Not on file    Active member of club or organization: Not on file    Attends meetings of clubs or organizations: Not on file    Relationship status: Not on file  Other Topics Concern  . Not on file  Social  History Narrative  . Not on file   Additional Social History:                         Sleep: Good  Appetite:  Good  Current Medications: Current Outpatient Medications  Medication Sig Dispense Refill  . citalopram (CELEXA) 40 MG tablet TAKE 1 TABLET (40 MG TOTAL) BY MOUTH IN THE MORNING 30 tablet 6  . PROAIR HFA 108 (90 Base) MCG/ACT inhaler Take 90 mcg by mouth 4 (four) times daily.  2   No current facility-administered medications for this visit.     Lab Results: No results found for this or any previous visit (from the past 48 hour(s)).  Blood Alcohol level:  Lab Results  Component Value Date   [facility] <5 03/03/2016   ETH <11 06/20/2012    Physical Findings: AIMS:  , ,  ,  ,    CIWA:    COWS:     Musculoskeletal: Strength & Muscle Tone: within normal limits Gait & Station: normal Patient leans: N/A  Psychiatric Specialty Exam: ROS  There were no vitals taken for this  visit.There is no height or weight on file to calculate BMI.  General Appearance: Casual  Eye Contact::  Good  Speech:  Clear and Coherent  Volume:  Normal  Mood:  Euthymic  Affect:  Congruent  Thought Process:  Coherent  Orientation:  Full (Time, Place, and Person)  Thought Content:  WDL  Suicidal Thoughts:  No  Homicidal Thoughts:  No  Memory:  NA  Judgement:  Good  Insight:  Fair  Psychomotor Activity:  Normal  Concentration:  Good  Recall:  Good  Fund of Knowledge:Good  Language: Good  Akathisia:  No  Handed:  Right  AIMS (if indicated):     Assets:  Desire for Improvement  ADL's:  Intact  Cognition: WNL  Sleep:      Treatment Plan Summary:  Today the patient is doing fairly well.  Her #1 problem and only problem is that of major depression in remission.  She continues taking Celexa 40 mg.  She is functioning fairly well.  She continues in one-to-one therapy.  She has no vegetative symptoms of significance.  This patient will return to see me in 4 months.Marland Kitchen

## 2018-12-26 DIAGNOSIS — F411 Generalized anxiety disorder: Secondary | ICD-10-CM | POA: Diagnosis not present

## 2019-01-02 ENCOUNTER — Other Ambulatory Visit (INDEPENDENT_AMBULATORY_CARE_PROVIDER_SITE_OTHER): Payer: 59

## 2019-01-02 ENCOUNTER — Encounter: Payer: Self-pay | Admitting: Internal Medicine

## 2019-01-02 ENCOUNTER — Other Ambulatory Visit: Payer: Self-pay

## 2019-01-02 ENCOUNTER — Ambulatory Visit (INDEPENDENT_AMBULATORY_CARE_PROVIDER_SITE_OTHER): Payer: 59 | Admitting: Internal Medicine

## 2019-01-02 VITALS — BP 110/70 | HR 72 | Temp 98.3°F | Ht 66.0 in | Wt 250.0 lb

## 2019-01-02 DIAGNOSIS — E1169 Type 2 diabetes mellitus with other specified complication: Secondary | ICD-10-CM | POA: Diagnosis not present

## 2019-01-02 DIAGNOSIS — E119 Type 2 diabetes mellitus without complications: Secondary | ICD-10-CM | POA: Insufficient documentation

## 2019-01-02 DIAGNOSIS — Z0000 Encounter for general adult medical examination without abnormal findings: Secondary | ICD-10-CM

## 2019-01-02 DIAGNOSIS — E669 Obesity, unspecified: Secondary | ICD-10-CM

## 2019-01-02 DIAGNOSIS — F331 Major depressive disorder, recurrent, moderate: Secondary | ICD-10-CM

## 2019-01-02 DIAGNOSIS — F411 Generalized anxiety disorder: Secondary | ICD-10-CM | POA: Diagnosis not present

## 2019-01-02 LAB — COMPREHENSIVE METABOLIC PANEL
ALT: 16 U/L (ref 0–35)
AST: 14 U/L (ref 0–37)
Albumin: 3.7 g/dL (ref 3.5–5.2)
Alkaline Phosphatase: 40 U/L (ref 39–117)
BUN: 11 mg/dL (ref 6–23)
CO2: 23 mEq/L (ref 19–32)
Calcium: 9 mg/dL (ref 8.4–10.5)
Chloride: 109 mEq/L (ref 96–112)
Creatinine, Ser: 0.74 mg/dL (ref 0.40–1.20)
GFR: 86.4 mL/min (ref >60.00)
Glucose, Bld: 146 mg/dL — ABNORMAL HIGH (ref 70–99)
Potassium: 4.1 mEq/L (ref 3.5–5.1)
Sodium: 140 mEq/L (ref 135–145)
Total Bilirubin: 0.6 mg/dL (ref 0.2–1.2)
Total Protein: 6.4 g/dL (ref 6.0–8.3)

## 2019-01-02 LAB — CBC
HCT: 41.8 % (ref 36.0–46.0)
Hemoglobin: 13.9 g/dL (ref 12.0–15.0)
MCHC: 33.3 g/dL (ref 30.0–36.0)
MCV: 89.7 fl (ref 78.0–100.0)
Platelets: 288 10*3/uL (ref 150.0–400.0)
RBC: 4.66 Mil/uL (ref 3.87–5.11)
RDW: 13 % (ref 11.5–15.5)
WBC: 7.5 10*3/uL (ref 4.0–10.5)

## 2019-01-02 LAB — HEMOGLOBIN A1C: Hgb A1c MFr Bld: 6.7 % — ABNORMAL HIGH (ref 4.6–6.5)

## 2019-01-02 LAB — LIPID PANEL
Cholesterol: 135 mg/dL (ref 0–200)
HDL: 40.5 mg/dL (ref >39.00)
LDL Cholesterol: 78 mg/dL (ref 0–99)
NonHDL: 94.08
Total CHOL/HDL Ratio: 3
Triglycerides: 80 mg/dL (ref 0.0–149.0)
VLDL: 16 mg/dL (ref 0.0–40.0)

## 2019-01-02 LAB — MICROALBUMIN / CREATININE URINE RATIO
Creatinine,U: 225.2 mg/dL
Microalb Creat Ratio: 0.3 mg/g (ref 0.0–30.0)
Microalb, Ur: <0.7 mg/dL (ref 0.0–1.9)

## 2019-01-02 LAB — TSH: TSH: 0.75 u[IU]/mL (ref 0.35–4.50)

## 2019-01-02 NOTE — Patient Instructions (Addendum)
Check on the shingrix to make sure it is covered and if it is you can call or send my chart message to get this done at our office.   Health Maintenance, Female Adopting a healthy lifestyle and getting preventive care are important in promoting health and wellness. Ask your health care provider about:  The right schedule for you to have regular tests and exams.  Things you can do on your own to prevent diseases and keep yourself healthy. What should I know about diet, weight, and exercise? Eat a healthy diet   Eat a diet that includes plenty of vegetables, fruits, low-fat dairy products, and lean protein.  Do not eat a lot of foods that are high in solid fats, added sugars, or sodium. Maintain a healthy weight Body mass index (BMI) is used to identify weight problems. It estimates body fat based on height and weight. Your health care provider can help determine your BMI and help you achieve or maintain a healthy weight. Get regular exercise Get regular exercise. This is one of the most important things you can do for your health. Most adults should:  Exercise for at least 150 minutes each week. The exercise should increase your heart rate and make you sweat (moderate-intensity exercise).  Do strengthening exercises at least twice a week. This is in addition to the moderate-intensity exercise.  Spend less time sitting. Even light physical activity can be beneficial. Watch cholesterol and blood lipids Have your blood tested for lipids and cholesterol at 41 years of age, then have this test every 5 years. Have your cholesterol levels checked more often if:  Your lipid or cholesterol levels are high.  You are older than 41 years of age.  You are at high risk for heart disease. What should I know about cancer screening? Depending on your health history and family history, you may need to have cancer screening at various ages. This may include screening for:  Breast cancer.  Cervical  cancer.  Colorectal cancer.  Skin cancer.  Lung cancer. What should I know about heart disease, diabetes, and high blood pressure? Blood pressure and heart disease  High blood pressure causes heart disease and increases the risk of stroke. This is more likely to develop in people who have high blood pressure readings, are of African descent, or are overweight.  Have your blood pressure checked: ? Every 3-5 years if you are 13-66 years of age. ? Every year if you are 56 years old or older. Diabetes Have regular diabetes screenings. This checks your fasting blood sugar level. Have the screening done:  Once every three years after age 67 if you are at a normal weight and have a low risk for diabetes.  More often and at a younger age if you are overweight or have a high risk for diabetes. What should I know about preventing infection? Hepatitis B If you have a higher risk for hepatitis B, you should be screened for this virus. Talk with your health care provider to find out if you are at risk for hepatitis B infection. Hepatitis C Testing is recommended for:  Everyone born from [birth year] through 1963.  Anyone with known risk factors for hepatitis C. Sexually transmitted infections (STIs)  Get screened for STIs, including gonorrhea and chlamydia, if: ? You are sexually active and are younger than 41 years of age. ? You are older than 41 years of age and your health care provider tells you that you are at risk for  this type of infection. ? Your sexual activity has changed since you were last screened, and you are at increased risk for chlamydia or gonorrhea. Ask your health care provider if you are at risk.  Ask your health care provider about whether you are at high risk for HIV. Your health care provider may recommend a prescription medicine to help prevent HIV infection. If you choose to take medicine to prevent HIV, you should first get tested for HIV. You should then be tested every 3  months for as long as you are taking the medicine. Pregnancy  If you are about to stop having your period (premenopausal) and you may become pregnant, seek counseling before you get pregnant.  Take 400 to 800 micrograms (mcg) of folic acid every day if you become pregnant.  Ask for birth control (contraception) if you want to prevent pregnancy. Osteoporosis and menopause Osteoporosis is a disease in which the bones lose minerals and strength with aging. This can result in bone fractures. If you are 80 years old or older, or if you are at risk for osteoporosis and fractures, ask your health care provider if you should:  Be screened for bone loss.  Take a calcium or vitamin D supplement to lower your risk of fractures.  Be given hormone replacement therapy (HRT) to treat symptoms of menopause. Follow these instructions at home: Lifestyle  Do not use any products that contain nicotine or tobacco, such as cigarettes, e-cigarettes, and chewing tobacco. If you need help quitting, ask your health care provider.  Do not use street drugs.  Do not share needles.  Ask your health care provider for help if you need support or information about quitting drugs. Alcohol use  Do not drink alcohol if: ? Your health care provider tells you not to drink. ? You are pregnant, may be pregnant, or are planning to become pregnant.  If you drink alcohol: ? Limit how much you use to 0-1 drink a day. ? Limit intake if you are breastfeeding.  Be aware of how much alcohol is in your drink. In the [place] one drink equals one 12 oz bottle of beer (355 mL), one 5 oz glass of wine (148 mL), or one 1 oz glass of hard liquor (44 mL). General instructions  Schedule regular health, dental, and eye exams.  Stay current with your vaccines.  Tell your health care provider if: ? You often feel depressed. ? You have ever been abused or do not feel safe at home. Summary  Adopting a healthy lifestyle and getting  preventive care are important in promoting health and wellness.  Follow your health care provider's instructions about healthy diet, exercising, and getting tested or screened for diseases.  Follow your health care provider's instructions on monitoring your cholesterol and blood pressure. This information is not intended to replace advice given to you by your health care provider. Make sure you discuss any questions you have with your health care provider. Document Released: 09/22/2010 Document Revised: 03/02/2018 Document Reviewed: 03/02/2018 Elsevier Patient Education  2020 [employer].

## 2019-01-02 NOTE — Assessment & Plan Note (Signed)
Stable on celexa and seeing therapist and psych. Coping skills in place and dealing okay with pandemic.

## 2019-01-02 NOTE — Progress Notes (Signed)
   Subjective:   Patient ID: [name], female    DOB: October 01, 1975, 43 y.o.   MRN: [id number]  HPI The patient is a 43 YO female coming in for physical.   PMH, [facility], social history reviewed and updated.   Review of Systems  Constitutional: Negative.   HENT: Negative.   Eyes: Negative.   Respiratory: Negative for cough, chest tightness and shortness of breath.   Cardiovascular: Negative for chest pain, palpitations and leg swelling.  Gastrointestinal: Negative for abdominal distention, abdominal pain, constipation, diarrhea, nausea and vomiting.  Musculoskeletal: Negative.   Skin: Negative.   Neurological: Negative.   Psychiatric/Behavioral: Negative.     Objective:  Physical Exam Constitutional:      Appearance: She is well-developed. She is obese.  HENT:     Head: Normocephalic and atraumatic.  Neck:     Musculoskeletal: Normal range of motion.  Cardiovascular:     Rate and Rhythm: Normal rate and regular rhythm.  Pulmonary:     Effort: Pulmonary effort is normal. No respiratory distress.     Breath sounds: Normal breath sounds. No wheezing or rales.  Abdominal:     General: Bowel sounds are normal. There is no distension.     Palpations: Abdomen is soft.     Tenderness: There is no abdominal tenderness. There is no rebound.  Skin:    General: Skin is warm and dry.  Neurological:     Mental Status: She is alert and oriented to person, place, and time.     Coordination: Coordination normal.     Vitals:   01/02/19 0754  BP: 110/70  Pulse: 72  Temp: 98.3 F (36.8 C)  TempSrc: Oral  SpO2: 98%  Weight: 250 lb (113.4 kg)  Height: 5\' 6"  (1.676 m)    Assessment & Plan:

## 2019-01-02 NOTE — Assessment & Plan Note (Signed)
Most recent HgA1c 7.0 which is at goal off meds. With pandemic weight is up slightly so recheck today. Foot exam done. Checking lipid and microalbumin. Adjust as needed.

## 2019-01-02 NOTE — Assessment & Plan Note (Signed)
Flu shot complete. Shingrix indicated due to past shingles 2 years ago she will check on coverage and if covered will get. Tetanus up to date. Mammogram yearly with gyn, pap smear up to date with gyn. Counseled about sun safety and mole surveillance. Counseled about the dangers of distracted driving. Given 10 year screening recommendations.

## 2019-01-03 ENCOUNTER — Encounter: Payer: Self-pay | Admitting: Internal Medicine

## 2019-01-05 ENCOUNTER — Encounter: Payer: Self-pay | Admitting: Internal Medicine

## 2019-01-05 NOTE — Progress Notes (Signed)
Abstracted and sent to scan  

## 2019-01-09 DIAGNOSIS — F411 Generalized anxiety disorder: Secondary | ICD-10-CM | POA: Diagnosis not present

## 2019-01-23 DIAGNOSIS — F411 Generalized anxiety disorder: Secondary | ICD-10-CM | POA: Diagnosis not present

## 2019-02-06 DIAGNOSIS — F411 Generalized anxiety disorder: Secondary | ICD-10-CM | POA: Diagnosis not present

## 2019-02-20 DIAGNOSIS — F411 Generalized anxiety disorder: Secondary | ICD-10-CM | POA: Diagnosis not present

## 2019-03-06 DIAGNOSIS — F411 Generalized anxiety disorder: Secondary | ICD-10-CM | POA: Diagnosis not present

## 2019-04-03 DIAGNOSIS — F411 Generalized anxiety disorder: Secondary | ICD-10-CM | POA: Diagnosis not present

## 2019-04-17 DIAGNOSIS — F411 Generalized anxiety disorder: Secondary | ICD-10-CM | POA: Diagnosis not present

## 2019-04-27 ENCOUNTER — Ambulatory Visit (HOSPITAL_COMMUNITY): Payer: 59 | Admitting: Psychiatry

## 2019-04-28 ENCOUNTER — Ambulatory Visit (INDEPENDENT_AMBULATORY_CARE_PROVIDER_SITE_OTHER): Payer: 59 | Admitting: Psychiatry

## 2019-04-28 ENCOUNTER — Other Ambulatory Visit: Payer: Self-pay

## 2019-04-28 DIAGNOSIS — F320 Major depressive disorder, single episode, mild: Secondary | ICD-10-CM | POA: Diagnosis not present

## 2019-04-28 DIAGNOSIS — F325 Major depressive disorder, single episode, in full remission: Secondary | ICD-10-CM

## 2019-04-28 MED ORDER — CITALOPRAM HYDROBROMIDE 40 MG PO TABS: ORAL_TABLET | ORAL | 6 refills | Status: DC

## 2019-04-28 MED FILL — CITALOPRAM HBR 40 MG TABLET: 40 | 30 days supply | Qty: 30 | Fill #0

## 2019-04-28 NOTE — Progress Notes (Signed)
[facility] MD Progress Note  04/28/2019 12:08 PM [name]  MRN:  [id number] Subjective: Feeling well Principal Problem: Major depression, recurrent episode remission Diagnosis:  Major depression, recurrent episode rem   Her and a patient is doing well.Her mood is stable. She takes care of her 42 year old daughter. Patient is working from home. She denies daily depression. She sleeping and eating well. She can concentrate well. He of alcohol or drugs. Is no psychosis. Patient is functioning actually quite well. She is very independent. Her thoughts are clear and organized. She is focused. She takes her antidepressant regularly. She continues in therapy with Ardeen Garland. Patient is positive and optimistic. She seems to be tolerating the pandemic fairly well. Past Surgical History:  Procedure Laterality Date  . BACK SURGERY  2004  . SPINAL FUSION  2004  . uterine ablation  2010   Family History:  Family History  Problem Relation Age of Onset  . Heart Problems Father   . Breast cancer Maternal Aunt   . Breast cancer Maternal Grandmother   . Breast cancer Maternal Aunt    Family Psychiatric  History:  Social History:  Social History   Substance and Sexual Activity  Alcohol Use No   Comment: Occasion mixed drink once a month     Social History   Substance and Sexual Activity  Drug Use No    Social History   Socioeconomic History  . Marital status: Divorced    Spouse name: Not on file  . Number of children: Not on file  . Years of education: Not on file  . Highest education level: Not on file  Occupational History  . Not on file  Tobacco Use  . Smoking status: Never Smoker  . Smokeless tobacco: Never Used  Substance and Sexual Activity  . Alcohol use: No    Comment: Occasion mixed drink once a month  . Drug use: No  . Sexual activity: Not Currently  Other Topics Concern  . Not on file  Social History Narrative  . Not on file   Social Determinants of Health    Financial Resource Strain:   . Difficulty of Paying Living Expenses: Not on file  Food Insecurity:   . Worried About Charity fundraiser in the Last Year: Not on file  . Ran Out of Food in the Last Year: Not on file  Transportation Needs:   . Lack of Transportation (Medical): Not on file  . Lack of Transportation (Non-Medical): Not on file  Physical Activity:   . Days of Exercise per Week: Not on file  . Minutes of Exercise per Session: Not on file  Stress:   . Feeling of Stress : Not on file  Social Connections:   . Frequency of Communication with Friends and Family: Not on file  . Frequency of Social Gatherings with Friends and Family: Not on file  . Attends Religious Services: Not on file  . Active Member of Clubs or Organizations: Not on file  . Attends Archivist Meetings: Not on file  . Marital Status: Not on file   Additional Social History:                         Sleep: Good  Appetite:  Good  Current Medications: Current Outpatient Medications  Medication Sig Dispense Refill  . citalopram (CELEXA) 40 MG tablet TAKE 1 TABLET (40 MG TOTAL) BY MOUTH IN THE MORNING 30 tablet 6  .  PROAIR HFA 108 (90 Base) MCG/ACT inhaler Take 90 mcg by mouth 4 (four) times daily.  2   No current facility-administered medications for this visit.    Lab Results: No results found for this or any previous visit (from the past 48 hour(s)).  Blood Alcohol level:  Lab Results  Component Value Date   [facility] <5 03/03/2016   ETH <11 06/20/2012    Physical Findings: AIMS:  , ,  ,  ,    CIWA:    COWS:     Musculoskeletal: Strength & Muscle Tone: within normal limits Gait & Station: normal Patient leans: N/A  Psychiatric Specialty Exam: ROS  There were no vitals taken for this visit.There is no height or weight on file to calculate BMI.  General Appearance: Casual  Eye Contact::  Good  Speech:  Clear and Coherent  Volume:  Normal  Mood:  Euthymic  Affect:   Congruent  Thought Process:  Coherent  Orientation:  Full (Time, Place, and Person)  Thought Content:  WDL  Suicidal Thoughts:  No  Homicidal Thoughts:  No  Memory:  NA  Judgement:  Good  Insight:  Fair  Psychomotor Activity:  Normal  Concentration:  Good  Recall:  Good  Fund of Knowledge:Good  Language: Good  Akathisia:  No  Handed:  Right  AIMS (if indicated):     Assets:  Desire for Improvement  ADL's:  Intact  Cognition: WNL  Sleep:      Treatment Plan Summary:  This patients One problem is that of Maj. Clinical impression.. She takes Celexa 40 mg without problem. She continues and one-to-one therapy.Patient is functioning well. She is very dedicated mother. Relationships are stable. She denies any physical complaints at this time.

## 2019-05-04 DIAGNOSIS — F411 Generalized anxiety disorder: Secondary | ICD-10-CM | POA: Diagnosis not present

## 2019-05-15 DIAGNOSIS — F411 Generalized anxiety disorder: Secondary | ICD-10-CM | POA: Diagnosis not present

## 2019-05-29 DIAGNOSIS — F411 Generalized anxiety disorder: Secondary | ICD-10-CM | POA: Diagnosis not present

## 2019-06-02 ENCOUNTER — Encounter: Payer: Self-pay | Admitting: Internal Medicine

## 2019-06-12 DIAGNOSIS — F411 Generalized anxiety disorder: Secondary | ICD-10-CM | POA: Diagnosis not present

## 2019-06-26 DIAGNOSIS — F411 Generalized anxiety disorder: Secondary | ICD-10-CM | POA: Diagnosis not present

## 2019-07-10 DIAGNOSIS — F411 Generalized anxiety disorder: Secondary | ICD-10-CM | POA: Diagnosis not present

## 2019-07-26 DIAGNOSIS — F411 Generalized anxiety disorder: Secondary | ICD-10-CM | POA: Diagnosis not present

## 2019-08-07 DIAGNOSIS — F411 Generalized anxiety disorder: Secondary | ICD-10-CM | POA: Diagnosis not present

## 2019-08-16 DIAGNOSIS — F411 Generalized anxiety disorder: Secondary | ICD-10-CM | POA: Diagnosis not present

## 2019-09-04 DIAGNOSIS — F4310 Post-traumatic stress disorder, unspecified: Secondary | ICD-10-CM | POA: Diagnosis not present

## 2019-09-18 DIAGNOSIS — F4310 Post-traumatic stress disorder, unspecified: Secondary | ICD-10-CM | POA: Diagnosis not present

## 2019-09-22 ENCOUNTER — Telehealth (INDEPENDENT_AMBULATORY_CARE_PROVIDER_SITE_OTHER): Payer: 59 | Admitting: Psychiatry

## 2019-09-22 ENCOUNTER — Other Ambulatory Visit: Payer: Self-pay

## 2019-09-22 DIAGNOSIS — F325 Major depressive disorder, single episode, in full remission: Secondary | ICD-10-CM | POA: Diagnosis not present

## 2019-09-22 DIAGNOSIS — F320 Major depressive disorder, single episode, mild: Secondary | ICD-10-CM

## 2019-09-22 MED ORDER — CITALOPRAM HYDROBROMIDE 40 MG PO TABS: ORAL_TABLET | ORAL | 6 refills | Status: DC

## 2019-09-22 MED FILL — CITALOPRAM HBR 40 MG TABLET: 40 | 30 days supply | Qty: 30 | Fill #0

## 2019-09-22 NOTE — Progress Notes (Signed)
[facility] MD Progress Note  09/22/2019 10:05 AM [name]  MRN:  [id number] Subjective: Feeling well Principal Problem: Major depression, recurrent episode remission Diagnosis:  Major depression, recurrent episode rem   Today the patient is actually doing very well.  She got a promotion at work.  Her 42 year old daughter is doing great.  She finished school.  She did well in school and she goes to [employer] camps and other summer camps.  Patient continues in one-to-one therapy.  She takes her medicines just as prescribed.  She denies use of excessive alcohol or drugs.  She has never been psychotic.  She is functioning very well.  Her health is good.  She takes her Celexa 40 mg on a regular basis.  Patient says that she is gained a little weight over the pandemic.  The patient has had her vaccines otherwise feels very healthy. Past Surgical History:  Procedure Laterality Date  . BACK SURGERY  2004  . SPINAL FUSION  2004  . uterine ablation  2010   Family History:  Family History  Problem Relation Age of Onset  . Heart Problems Father   . Breast cancer Maternal Aunt   . Breast cancer Maternal Grandmother   . Breast cancer Maternal Aunt    Family Psychiatric  History:  Social History:  Social History   Substance and Sexual Activity  Alcohol Use No   Comment: Occasion mixed drink once a month     Social History   Substance and Sexual Activity  Drug Use No    Social History   Socioeconomic History  . Marital status: Divorced    Spouse name: Not on file  . Number of children: Not on file  . Years of education: Not on file  . Highest education level: Not on file  Occupational History  . Not on file  Tobacco Use  . Smoking status: Never Smoker  . Smokeless tobacco: Never Used  Vaping Use  . Vaping Use: Never used  Substance and Sexual Activity  . Alcohol use: No    Comment: Occasion mixed drink once a month  . Drug use: No  . Sexual activity: Not Currently  Other  Topics Concern  . Not on file  Social History Narrative  . Not on file   Social Determinants of Health   Financial Resource Strain:   . Difficulty of Paying Living Expenses:   Food Insecurity:   . Worried About Programme researcher, broadcasting/film/video in the Last Year:   . Barista in the Last Year:   Transportation Needs:   . Freight forwarder (Medical):   Marland Kitchen Lack of Transportation (Non-Medical):   Physical Activity:   . Days of Exercise per Week:   . Minutes of Exercise per Session:   Stress:   . Feeling of Stress :   Social Connections:   . Frequency of Communication with Friends and Family:   . Frequency of Social Gatherings with Friends and Family:   . Attends Religious Services:   . Active Member of Clubs or Organizations:   . Attends Banker Meetings:   Marland Kitchen Marital Status:    Additional Social History:                         Sleep: Good  Appetite:  Good  Current Medications: Current Outpatient Medications  Medication Sig Dispense Refill  . citalopram (CELEXA) 40 MG tablet TAKE 1 TABLET (40 MG TOTAL)  BY MOUTH IN THE MORNING 30 tablet 6  . PROAIR HFA 108 (90 Base) MCG/ACT inhaler Take 90 mcg by mouth 4 (four) times daily.  2   No current facility-administered medications for this visit.    Lab Results: No results found for this or any previous visit (from the past 48 hour(s)).  Blood Alcohol level:  Lab Results  Component Value Date   [facility] <5 03/03/2016   ETH <11 06/20/2012    Physical Findings: AIMS:  , ,  ,  ,    CIWA:    COWS:     Musculoskeletal: Strength & Muscle Tone: within normal limits Gait & Station: normal Patient leans: N/A  Psychiatric Specialty Exam: ROS  There were no vitals taken for this visit.There is no height or weight on file to calculate BMI.  General Appearance: Casual  Eye Contact::  Good  Speech:  Clear and Coherent  Volume:  Normal  Mood:  Euthymic  Affect:  Congruent  Thought Process:  Coherent   Orientation:  Full (Time, Place, and Person)  Thought Content:  WDL  Suicidal Thoughts:  No  Homicidal Thoughts:  No  Memory:  NA  Judgement:  Good  Insight:  Fair  Psychomotor Activity:  Normal  Concentration:  Good  Recall:  Good  Fund of Knowledge:Good  Language: Good  Akathisia:  No  Handed:  Right  AIMS (if indicated):     Assets:  Desire for Improvement  ADL's:  Intact  Cognition: WNL  Sleep:      Treatment Plan Summary:  This patient is #1 problem is that of major depression.  She will continue taking 40 mg of Celexa.  She also continues in one-to-one therapy.  She is functioning very well.  Financially she is stable and her health is good.  She will return to see me in 6 months.

## 2019-10-16 DIAGNOSIS — F4310 Post-traumatic stress disorder, unspecified: Secondary | ICD-10-CM | POA: Diagnosis not present

## 2019-10-30 DIAGNOSIS — F4310 Post-traumatic stress disorder, unspecified: Secondary | ICD-10-CM | POA: Diagnosis not present

## 2019-11-13 DIAGNOSIS — F4310 Post-traumatic stress disorder, unspecified: Secondary | ICD-10-CM | POA: Diagnosis not present

## 2019-11-30 DIAGNOSIS — F4310 Post-traumatic stress disorder, unspecified: Secondary | ICD-10-CM | POA: Diagnosis not present

## 2019-12-11 DIAGNOSIS — F4310 Post-traumatic stress disorder, unspecified: Secondary | ICD-10-CM | POA: Diagnosis not present

## 2019-12-19 DIAGNOSIS — E119 Type 2 diabetes mellitus without complications: Secondary | ICD-10-CM | POA: Diagnosis not present

## 2019-12-25 DIAGNOSIS — F4310 Post-traumatic stress disorder, unspecified: Secondary | ICD-10-CM | POA: Diagnosis not present

## 2020-01-03 ENCOUNTER — Encounter: Payer: 59 | Admitting: Internal Medicine

## 2020-01-20 ENCOUNTER — Ambulatory Visit: Payer: 59 | Attending: Family Medicine

## 2020-01-20 DIAGNOSIS — Z23 Encounter for immunization: Secondary | ICD-10-CM

## 2020-01-20 NOTE — Progress Notes (Signed)
   Covid-19 Vaccination Clinic  Name:  [name]    MRN: [id number] DOB: 1976/01/05  01/20/2020  [name] was observed post Covid-19 immunization for 15 minutes without incident. She was provided with Vaccine Information Sheet and instruction to access the V-Safe system.   [name] was instructed to call 911 with any severe reactions post vaccine: Marland Kitchen Difficulty breathing  . Swelling of face and throat  . A fast heartbeat  . A bad rash all over body  . Dizziness and weakness

## 2020-01-22 DIAGNOSIS — F4310 Post-traumatic stress disorder, unspecified: Secondary | ICD-10-CM | POA: Diagnosis not present

## 2020-02-05 DIAGNOSIS — F4310 Post-traumatic stress disorder, unspecified: Secondary | ICD-10-CM | POA: Diagnosis not present

## 2020-02-19 DIAGNOSIS — F4310 Post-traumatic stress disorder, unspecified: Secondary | ICD-10-CM | POA: Diagnosis not present

## 2020-03-01 ENCOUNTER — Other Ambulatory Visit: Payer: Self-pay

## 2020-03-01 ENCOUNTER — Ambulatory Visit (INDEPENDENT_AMBULATORY_CARE_PROVIDER_SITE_OTHER): Payer: 59 | Admitting: Internal Medicine

## 2020-03-01 ENCOUNTER — Encounter: Payer: Self-pay | Admitting: Internal Medicine

## 2020-03-01 VITALS — BP 120/74 | HR 71 | Temp 98.0°F | Ht 66.0 in | Wt 245.0 lb

## 2020-03-01 DIAGNOSIS — F331 Major depressive disorder, recurrent, moderate: Secondary | ICD-10-CM

## 2020-03-01 DIAGNOSIS — Z0000 Encounter for general adult medical examination without abnormal findings: Secondary | ICD-10-CM | POA: Diagnosis not present

## 2020-03-01 DIAGNOSIS — Z23 Encounter for immunization: Secondary | ICD-10-CM

## 2020-03-01 DIAGNOSIS — E1169 Type 2 diabetes mellitus with other specified complication: Secondary | ICD-10-CM | POA: Diagnosis not present

## 2020-03-01 DIAGNOSIS — E669 Obesity, unspecified: Secondary | ICD-10-CM | POA: Diagnosis not present

## 2020-03-01 LAB — LIPID PANEL
Cholesterol: 163 mg/dL (ref 0–200)
HDL: 43.3 mg/dL (ref >39.00)
LDL Cholesterol: 110 mg/dL — ABNORMAL HIGH (ref 0–99)
NonHDL: 120.09
Total CHOL/HDL Ratio: 4
Triglycerides: 51 mg/dL (ref 0.0–149.0)
VLDL: 10.2 mg/dL (ref 0.0–40.0)

## 2020-03-01 LAB — COMPREHENSIVE METABOLIC PANEL
ALT: 11 U/L (ref 0–35)
AST: 14 U/L (ref 0–37)
Albumin: 4 g/dL (ref 3.5–5.2)
Alkaline Phosphatase: 49 U/L (ref 39–117)
BUN: 11 mg/dL (ref 6–23)
CO2: 26 mEq/L (ref 19–32)
Calcium: 9.1 mg/dL (ref 8.4–10.5)
Chloride: 103 mEq/L (ref 96–112)
Creatinine, Ser: 0.79 mg/dL (ref 0.40–1.20)
GFR: 92.29 mL/min (ref >60.00)
Glucose, Bld: 104 mg/dL — ABNORMAL HIGH (ref 70–99)
Potassium: 3.7 mEq/L (ref 3.5–5.1)
Sodium: 137 mEq/L (ref 135–145)
Total Bilirubin: 0.9 mg/dL (ref 0.2–1.2)
Total Protein: 7.4 g/dL (ref 6.0–8.3)

## 2020-03-01 LAB — TSH: TSH: 0.79 u[IU]/mL (ref 0.35–4.50)

## 2020-03-01 LAB — VITAMIN B12: Vitamin B-12: 208 pg/mL — ABNORMAL LOW (ref 211–911)

## 2020-03-01 LAB — MICROALBUMIN / CREATININE URINE RATIO
Creatinine,U: 21 mg/dL
Microalb Creat Ratio: 3.3 mg/g (ref 0.0–30.0)
Microalb, Ur: <0.7 mg/dL (ref 0.0–1.9)

## 2020-03-01 LAB — CBC
HCT: 41.4 % (ref 36.0–46.0)
Hemoglobin: 13.9 g/dL (ref 12.0–15.0)
MCHC: 33.5 g/dL (ref 30.0–36.0)
MCV: 88.1 fl (ref 78.0–100.0)
Platelets: 331 10*3/uL (ref 150.0–400.0)
RBC: 4.71 Mil/uL (ref 3.87–5.11)
RDW: 12.3 % (ref 11.5–15.5)
WBC: 7.8 10*3/uL (ref 4.0–10.5)

## 2020-03-01 LAB — HEMOGLOBIN A1C: Hgb A1c MFr Bld: 6.5 % (ref 4.6–6.5)

## 2020-03-01 NOTE — Assessment & Plan Note (Signed)
Stopped celexa since last year and doing okay. Coping well with pandemic.

## 2020-03-01 NOTE — Assessment & Plan Note (Signed)
Flu shot up to date. Covid-19 up to date including booster. Pneumonia 23 given today. Tetanus up to date. Mammogram up to date, pap smear up to date. Counseled about sun safety and mole surveillance. Counseled about the dangers of distracted driving. Given 10 year screening recommendations.

## 2020-03-01 NOTE — Progress Notes (Signed)
   Subjective:   Patient ID: [name], female    DOB: [birth year], 44 y.o.   MRN: [id number]  HPI The patient is a 44 YO female coming in for physical.   PMH, [facility], social history reviewed and updated.  Review of Systems  Constitutional: Negative.   HENT: Negative.   Eyes: Negative.   Respiratory: Negative for cough, chest tightness and shortness of breath.   Cardiovascular: Negative for chest pain, palpitations and leg swelling.  Gastrointestinal: Negative for abdominal distention, abdominal pain, constipation, diarrhea, nausea and vomiting.  Musculoskeletal: Negative.   Skin: Negative.   Neurological: Negative.   Psychiatric/Behavioral: Negative.     Objective:  Physical Exam Constitutional:      Appearance: She is well-developed and well-nourished. She is obese.  HENT:     Head: Normocephalic and atraumatic.  Eyes:     Extraocular Movements: EOM normal.  Cardiovascular:     Rate and Rhythm: Normal rate and regular rhythm.  Pulmonary:     Effort: Pulmonary effort is normal. No respiratory distress.     Breath sounds: Normal breath sounds. No wheezing or rales.  Abdominal:     General: Bowel sounds are normal. There is no distension.     Palpations: Abdomen is soft.     Tenderness: There is no abdominal tenderness. There is no rebound.  Musculoskeletal:        General: No edema.     Cervical back: Normal range of motion.  Skin:    General: Skin is warm and dry.  Neurological:     Mental Status: She is alert and oriented to person, place, and time.     Coordination: Coordination normal.  Psychiatric:        Mood and Affect: Mood and affect normal.     Vitals:   03/01/20 1303  BP: 120/74  Pulse: 71  Temp: 98 F (36.7 C)  TempSrc: Oral  SpO2: 99%  Weight: 245 lb (111.1 kg)  Height: 5\' 6"  (1.676 m)    This visit occurred during the SARS-CoV-2 public health emergency.  Safety protocols were in place, including screening questions prior to the visit,  additional usage of staff PPE, and extensive cleaning of exam room while observing appropriate contact time as indicated for disinfecting solutions.   Assessment & Plan:  Pneumonia 23 given at visit

## 2020-03-01 NOTE — Assessment & Plan Note (Addendum)
Foot exam done. Checking HgA1c and microalbumin to creatinine ratio. Diet controlled. Not on ACE-I/ARB or statin. Eye exam reminded.

## 2020-03-01 NOTE — Patient Instructions (Signed)
Health Maintenance, Female Adopting a healthy lifestyle and getting preventive care are important in promoting health and wellness. Ask your health care provider about:  The right schedule for you to have regular tests and exams.  Things you can do on your own to prevent diseases and keep yourself healthy. What should I know about diet, weight, and exercise? Eat a healthy diet   Eat a diet that includes plenty of vegetables, fruits, low-fat dairy products, and lean protein.  Do not eat a lot of foods that are high in solid fats, added sugars, or sodium. Maintain a healthy weight Body mass index (BMI) is used to identify weight problems. It estimates body fat based on height and weight. Your health care provider can help determine your BMI and help you achieve or maintain a healthy weight. Get regular exercise Get regular exercise. This is one of the most important things you can do for your health. Most adults should:  Exercise for at least 150 minutes each week. The exercise should increase your heart rate and make you sweat (moderate-intensity exercise).  Do strengthening exercises at least twice a week. This is in addition to the moderate-intensity exercise.  Spend less time sitting. Even light physical activity can be beneficial. Watch cholesterol and blood lipids Have your blood tested for lipids and cholesterol at 42 years of age, then have this test every 5 years. Have your cholesterol levels checked more often if:  Your lipid or cholesterol levels are high.  You are older than 42 years of age.  You are at high risk for heart disease. What should I know about cancer screening? Depending on your health history and family history, you may need to have cancer screening at various ages. This may include screening for:  Breast cancer.  Cervical cancer.  Colorectal cancer.  Skin cancer.  Lung cancer. What should I know about heart disease, diabetes, and high blood  pressure? Blood pressure and heart disease  High blood pressure causes heart disease and increases the risk of stroke. This is more likely to develop in people who have high blood pressure readings, are of African descent, or are overweight.  Have your blood pressure checked: ? Every 3-5 years if you are 18-39 years of age. ? Every year if you are 40 years old or older. Diabetes Have regular diabetes screenings. This checks your fasting blood sugar level. Have the screening done:  Once every three years after age 40 if you are at a normal weight and have a low risk for diabetes.  More often and at a younger age if you are overweight or have a high risk for diabetes. What should I know about preventing infection? Hepatitis B If you have a higher risk for hepatitis B, you should be screened for this virus. Talk with your health care provider to find out if you are at risk for hepatitis B infection. Hepatitis C Testing is recommended for:  Everyone born from 1945 through 1965.  Anyone with known risk factors for hepatitis C. Sexually transmitted infections (STIs)  Get screened for STIs, including gonorrhea and chlamydia, if: ? You are sexually active and are younger than 42 years of age. ? You are older than 42 years of age and your health care provider tells you that you are at risk for this type of infection. ? Your sexual activity has changed since you were last screened, and you are at increased risk for chlamydia or gonorrhea. Ask your health care provider if   you are at risk.  Ask your health care provider about whether you are at high risk for HIV. Your health care provider may recommend a prescription medicine to help prevent HIV infection. If you choose to take medicine to prevent HIV, you should first get tested for HIV. You should then be tested every 3 months for as long as you are taking the medicine. Pregnancy  If you are about to stop having your period (premenopausal) and  you may become pregnant, seek counseling before you get pregnant.  Take 400 to 800 micrograms (mcg) of folic acid every day if you become pregnant.  Ask for birth control (contraception) if you want to prevent pregnancy. Osteoporosis and menopause Osteoporosis is a disease in which the bones lose minerals and strength with aging. This can result in bone fractures. If you are 65 years old or older, or if you are at risk for osteoporosis and fractures, ask your health care provider if you should:  Be screened for bone loss.  Take a calcium or vitamin D supplement to lower your risk of fractures.  Be given hormone replacement therapy (HRT) to treat symptoms of menopause. Follow these instructions at home: Lifestyle  Do not use any products that contain nicotine or tobacco, such as cigarettes, e-cigarettes, and chewing tobacco. If you need help quitting, ask your health care provider.  Do not use street drugs.  Do not share needles.  Ask your health care provider for help if you need support or information about quitting drugs. Alcohol use  Do not drink alcohol if: ? Your health care provider tells you not to drink. ? You are pregnant, may be pregnant, or are planning to become pregnant.  If you drink alcohol: ? Limit how much you use to 0-1 drink a day. ? Limit intake if you are breastfeeding.  Be aware of how much alcohol is in your drink. In the [place] one drink equals one 12 oz bottle of beer (355 mL), one 5 oz glass of wine (148 mL), or one 1 oz glass of hard liquor (44 mL). General instructions  Schedule regular health, dental, and eye exams.  Stay current with your vaccines.  Tell your health care provider if: ? You often feel depressed. ? You have ever been abused or do not feel safe at home. Summary  Adopting a healthy lifestyle and getting preventive care are important in promoting health and wellness.  Follow your health care provider's instructions about healthy  diet, exercising, and getting tested or screened for diseases.  Follow your health care provider's instructions on monitoring your cholesterol and blood pressure. This information is not intended to replace advice given to you by your health care provider. Make sure you discuss any questions you have with your health care provider. Document Revised: 03/02/2018 Document Reviewed: 03/02/2018 Elsevier Patient Education  2020 Elsevier Inc.  

## 2020-03-04 DIAGNOSIS — F4310 Post-traumatic stress disorder, unspecified: Secondary | ICD-10-CM | POA: Diagnosis not present

## 2020-03-29 ENCOUNTER — Telehealth (HOSPITAL_COMMUNITY): Payer: 59 | Admitting: Psychiatry

## 2020-03-29 ENCOUNTER — Other Ambulatory Visit: Payer: Self-pay

## 2020-04-01 DIAGNOSIS — F4310 Post-traumatic stress disorder, unspecified: Secondary | ICD-10-CM | POA: Diagnosis not present

## 2020-04-15 DIAGNOSIS — F4310 Post-traumatic stress disorder, unspecified: Secondary | ICD-10-CM | POA: Diagnosis not present

## 2020-04-29 DIAGNOSIS — F4310 Post-traumatic stress disorder, unspecified: Secondary | ICD-10-CM | POA: Diagnosis not present

## 2020-05-13 DIAGNOSIS — F4310 Post-traumatic stress disorder, unspecified: Secondary | ICD-10-CM | POA: Diagnosis not present

## 2020-05-23 ENCOUNTER — Other Ambulatory Visit: Payer: 59

## 2020-05-27 DIAGNOSIS — F4310 Post-traumatic stress disorder, unspecified: Secondary | ICD-10-CM | POA: Diagnosis not present

## 2020-06-24 DIAGNOSIS — F4310 Post-traumatic stress disorder, unspecified: Secondary | ICD-10-CM | POA: Diagnosis not present

## 2020-07-08 DIAGNOSIS — F4310 Post-traumatic stress disorder, unspecified: Secondary | ICD-10-CM | POA: Diagnosis not present

## 2020-07-22 DIAGNOSIS — F4310 Post-traumatic stress disorder, unspecified: Secondary | ICD-10-CM | POA: Diagnosis not present

## 2020-07-29 DIAGNOSIS — F4310 Post-traumatic stress disorder, unspecified: Secondary | ICD-10-CM | POA: Diagnosis not present

## 2020-09-02 DIAGNOSIS — F4310 Post-traumatic stress disorder, unspecified: Secondary | ICD-10-CM | POA: Diagnosis not present

## 2020-09-16 DIAGNOSIS — F4310 Post-traumatic stress disorder, unspecified: Secondary | ICD-10-CM | POA: Diagnosis not present

## 2020-09-18 ENCOUNTER — Ambulatory Visit: Payer: 59 | Admitting: Internal Medicine

## 2020-09-18 ENCOUNTER — Encounter: Payer: Self-pay | Admitting: Internal Medicine

## 2020-09-18 ENCOUNTER — Other Ambulatory Visit: Payer: Self-pay

## 2020-09-18 DIAGNOSIS — H9193 Unspecified hearing loss, bilateral: Secondary | ICD-10-CM | POA: Insufficient documentation

## 2020-09-18 NOTE — Assessment & Plan Note (Signed)
Ear lavage done and advised to try flonase (she has at home) for 1-2 weeks to see if this helps with hearing change. This is some possible fluid in the ears. If no improvement will refer to audiology for hearing assessment.

## 2020-09-18 NOTE — Progress Notes (Signed)
   Subjective:   Patient ID: [name], female    DOB: 02-06-1976, 45 y.o.   MRN: [id number]  HPI The patient is a 45 YO female coming in for decreased hearing.Noticed over the past few months harder to hear sounds from behind and using some lip reading to help. Denies worsening sinus problems. Does not take any allergy medication. No recent loud noise exposure.   Review of Systems  Constitutional: Negative.   HENT:  Positive for hearing loss.   Eyes: Negative.   Respiratory:  Negative for cough, chest tightness and shortness of breath.   Cardiovascular:  Negative for chest pain, palpitations and leg swelling.  Gastrointestinal:  Negative for abdominal distention, abdominal pain, constipation, diarrhea, nausea and vomiting.  Musculoskeletal: Negative.   Skin: Negative.   Neurological: Negative.   Psychiatric/Behavioral: Negative.     Objective:  Physical Exam Constitutional:      Appearance: She is well-developed.  HENT:     Head: Normocephalic and atraumatic.     Ears:     Comments: Bilateral ears irrigated and TM normal to minimal clear bulging bilateral.  Cardiovascular:     Rate and Rhythm: Normal rate and regular rhythm.  Pulmonary:     Effort: Pulmonary effort is normal. No respiratory distress.     Breath sounds: Normal breath sounds. No wheezing or rales.  Abdominal:     General: Bowel sounds are normal. There is no distension.     Palpations: Abdomen is soft.     Tenderness: There is no abdominal tenderness. There is no rebound.  Musculoskeletal:     Cervical back: Normal range of motion.  Skin:    General: Skin is warm and dry.  Neurological:     Mental Status: She is alert and oriented to person, place, and time.     Coordination: Coordination normal.    Vitals:   09/18/20 0909  BP: 118/70  Pulse: 66  Resp: 18  Temp: 98.5 F (36.9 C)  TempSrc: Oral  SpO2: 99%  Weight: 259 lb 6.4 oz (117.7 kg)  Height: 5\' 6"  (1.676 m)    This visit occurred  during the SARS-CoV-2 public health emergency.  Safety protocols were in place, including screening questions prior to the visit, additional usage of staff PPE, and extensive cleaning of exam room while observing appropriate contact time as indicated for disinfecting solutions.   Assessment & Plan:

## 2020-09-18 NOTE — Patient Instructions (Signed)
Use the flonase for a week or so to see if this helps.  2 sprays each nostril once a day for the flonase.

## 2020-09-30 DIAGNOSIS — F4310 Post-traumatic stress disorder, unspecified: Secondary | ICD-10-CM | POA: Diagnosis not present

## 2020-10-14 DIAGNOSIS — F4310 Post-traumatic stress disorder, unspecified: Secondary | ICD-10-CM | POA: Diagnosis not present

## 2020-10-28 DIAGNOSIS — F4310 Post-traumatic stress disorder, unspecified: Secondary | ICD-10-CM | POA: Diagnosis not present

## 2020-11-05 ENCOUNTER — Encounter: Payer: Self-pay | Admitting: Internal Medicine

## 2020-11-05 DIAGNOSIS — H9193 Unspecified hearing loss, bilateral: Secondary | ICD-10-CM

## 2020-12-02 ENCOUNTER — Other Ambulatory Visit: Payer: Self-pay

## 2020-12-02 ENCOUNTER — Ambulatory Visit: Payer: 59 | Attending: Internal Medicine | Admitting: Audiologist

## 2020-12-02 DIAGNOSIS — H93293 Other abnormal auditory perceptions, bilateral: Secondary | ICD-10-CM | POA: Diagnosis not present

## 2020-12-02 NOTE — Procedures (Signed)
  [facility] [address] [place], [place]  [address] [phone]  AUDIOLOGICAL  EVALUATION  NAME: [name]     DOB:   07-13-1975      MRN: [id number]                                                                                     DATE: 12/02/2020     REFERENT: Myrlene Broker, MD STATUS: Outpatient DIAGNOSIS: Normal Hearing, Abnormal Auditory Perception    History: [name] , 45 y.o. , was seen for an audiological evaluation. [name] is reporting a feeling of muffled hearing in the right ear and increased difficulty hearing people from a distance. She cannot hear her daughter when she calls from another room. In loud noise she is having to ask people to repeat themselves more. She feels she hears better when she puts her finger in her right ear. This started gradually a few months ago. Her doctor told her to try a decongestant but that did not help. Medical history negative for any warning signs for hearing loss. No other relevant case history reported.    Evaluation:  Otoscopy showed a clear view of the tympanic membranes, bilaterally, some scarring present on both TMs Tympanometry results were consistent with normal middle ear function bilaterally   Audiometric testing was completed using Conventional Audiometry techniques over insert transducer. Test results are consistent with normal hearing 250-8k Hz in both ears. Speech detection thresholds 10dB in the right ear and 15dB in the left ear. Word recognition with a Nu6 list was good in both ears at 40dB SL.    Results:  The test results were reviewed with  [name]. Hearing is normal in both ears. She was able to understand and repeat words down to a whisper level in both ears. [name] was cooperative and engaged in today's testing, responses are all reliable. There is no indication of hearing loss at this time.    Recommendations: 1.   No further audiologic testing is needed  unless future hearing concerns arise. If other symptoms appear, such as pain or pressure, recommend following up with an ENT Physician.    [name]  Audiologist, Au.D., CCC-A

## 2020-12-09 DIAGNOSIS — F4310 Post-traumatic stress disorder, unspecified: Secondary | ICD-10-CM | POA: Diagnosis not present

## 2020-12-23 DIAGNOSIS — F4310 Post-traumatic stress disorder, unspecified: Secondary | ICD-10-CM | POA: Diagnosis not present

## 2021-01-06 ENCOUNTER — Other Ambulatory Visit (HOSPITAL_COMMUNITY): Payer: Self-pay

## 2021-01-06 ENCOUNTER — Telehealth: Payer: 59 | Admitting: Family Medicine

## 2021-01-06 DIAGNOSIS — M545 Low back pain, unspecified: Secondary | ICD-10-CM

## 2021-01-06 DIAGNOSIS — M62830 Muscle spasm of back: Secondary | ICD-10-CM | POA: Diagnosis not present

## 2021-01-06 MED ORDER — NAPROXEN 500 MG PO TABS
500.0000 mg | ORAL_TABLET | Freq: Two times a day (BID) | ORAL | 0 refills | Status: DC
  Filled 2021-01-06: qty 30, 15d supply, fill #0

## 2021-01-06 MED ORDER — CYCLOBENZAPRINE HCL 10 MG PO TABS
10.0000 mg | ORAL_TABLET | Freq: Three times a day (TID) | ORAL | 0 refills | Status: DC | PRN
  Filled 2021-01-06: qty 30, 10d supply, fill #0

## 2021-01-06 NOTE — Progress Notes (Signed)

## 2021-01-20 DIAGNOSIS — F4310 Post-traumatic stress disorder, unspecified: Secondary | ICD-10-CM | POA: Diagnosis not present

## 2021-01-22 ENCOUNTER — Encounter: Payer: Self-pay | Admitting: Internal Medicine

## 2021-02-03 DIAGNOSIS — F4310 Post-traumatic stress disorder, unspecified: Secondary | ICD-10-CM | POA: Diagnosis not present

## 2021-02-17 DIAGNOSIS — F4310 Post-traumatic stress disorder, unspecified: Secondary | ICD-10-CM | POA: Diagnosis not present

## 2021-03-03 DIAGNOSIS — F4310 Post-traumatic stress disorder, unspecified: Secondary | ICD-10-CM | POA: Diagnosis not present

## 2021-03-04 ENCOUNTER — Other Ambulatory Visit: Payer: Self-pay

## 2021-03-04 ENCOUNTER — Encounter: Payer: Self-pay | Admitting: Internal Medicine

## 2021-03-04 ENCOUNTER — Ambulatory Visit (INDEPENDENT_AMBULATORY_CARE_PROVIDER_SITE_OTHER): Payer: 59 | Admitting: Internal Medicine

## 2021-03-04 VITALS — BP 120/78 | HR 73 | Resp 18 | Ht 66.0 in | Wt 265.6 lb

## 2021-03-04 DIAGNOSIS — Z803 Family history of malignant neoplasm of breast: Secondary | ICD-10-CM

## 2021-03-04 DIAGNOSIS — F331 Major depressive disorder, recurrent, moderate: Secondary | ICD-10-CM

## 2021-03-04 DIAGNOSIS — E1169 Type 2 diabetes mellitus with other specified complication: Secondary | ICD-10-CM

## 2021-03-04 DIAGNOSIS — Z0000 Encounter for general adult medical examination without abnormal findings: Secondary | ICD-10-CM | POA: Diagnosis not present

## 2021-03-04 DIAGNOSIS — E669 Obesity, unspecified: Secondary | ICD-10-CM | POA: Diagnosis not present

## 2021-03-04 LAB — COMPREHENSIVE METABOLIC PANEL
ALT: 13 U/L (ref 0–35)
AST: 14 U/L (ref 0–37)
Albumin: 3.7 g/dL (ref 3.5–5.2)
Alkaline Phosphatase: 47 U/L (ref 39–117)
BUN: 18 mg/dL (ref 6–23)
CO2: 26 mEq/L (ref 19–32)
Calcium: 9.3 mg/dL (ref 8.4–10.5)
Chloride: 103 mEq/L (ref 96–112)
Creatinine, Ser: 0.75 mg/dL (ref 0.40–1.20)
GFR: 97.54 mL/min (ref >60.00)
Glucose, Bld: 176 mg/dL — ABNORMAL HIGH (ref 70–99)
Potassium: 4.2 mEq/L (ref 3.5–5.1)
Sodium: 136 mEq/L (ref 135–145)
Total Bilirubin: 0.7 mg/dL (ref 0.2–1.2)
Total Protein: 6.8 g/dL (ref 6.0–8.3)

## 2021-03-04 LAB — LIPID PANEL
Cholesterol: 160 mg/dL (ref 0–200)
HDL: 47.2 mg/dL (ref >39.00)
LDL Cholesterol: 100 mg/dL — ABNORMAL HIGH (ref 0–99)
NonHDL: 113.06
Total CHOL/HDL Ratio: 3
Triglycerides: 65 mg/dL (ref 0.0–149.0)
VLDL: 13 mg/dL (ref 0.0–40.0)

## 2021-03-04 LAB — MICROALBUMIN / CREATININE URINE RATIO
Creatinine,U: 178.9 mg/dL
Microalb Creat Ratio: 0.4 mg/g (ref 0.0–30.0)
Microalb, Ur: <0.7 mg/dL (ref 0.0–1.9)

## 2021-03-04 LAB — CBC
HCT: 40.7 % (ref 36.0–46.0)
Hemoglobin: 13.7 g/dL (ref 12.0–15.0)
MCHC: 33.7 g/dL (ref 30.0–36.0)
MCV: 89.2 fl (ref 78.0–100.0)
Platelets: 308 10*3/uL (ref 150.0–400.0)
RBC: 4.57 Mil/uL (ref 3.87–5.11)
RDW: 12.5 % (ref 11.5–15.5)
WBC: 7.2 10*3/uL (ref 4.0–10.5)

## 2021-03-04 LAB — HEMOGLOBIN A1C: Hgb A1c MFr Bld: 7.2 % — ABNORMAL HIGH (ref 4.6–6.5)

## 2021-03-04 NOTE — Progress Notes (Signed)
   Subjective:   Patient ID: [name], female    DOB: 1975/12/01, 45 y.o.   MRN: [id number]  HPI The patient is a 45 YO female coming in for physical.   PMH, FMH, social history reviewed and updated  Review of Systems  Constitutional: Negative.   HENT:  Positive for hearing loss.   Eyes: Negative.   Respiratory:  Negative for cough, chest tightness and shortness of breath.   Cardiovascular:  Negative for chest pain, palpitations and leg swelling.  Gastrointestinal:  Negative for abdominal distention, abdominal pain, constipation, diarrhea, nausea and vomiting.  Musculoskeletal: Negative.   Skin: Negative.   Neurological: Negative.   Psychiatric/Behavioral: Negative.     Objective:  Physical Exam Constitutional:      Appearance: She is well-developed. She is obese.  HENT:     Head: Normocephalic and atraumatic.  Cardiovascular:     Rate and Rhythm: Normal rate and regular rhythm.  Pulmonary:     Effort: Pulmonary effort is normal. No respiratory distress.     Breath sounds: Normal breath sounds. No wheezing or rales.  Abdominal:     General: Bowel sounds are normal. There is no distension.     Palpations: Abdomen is soft.     Tenderness: There is no abdominal tenderness. There is no rebound.  Musculoskeletal:     Cervical back: Normal range of motion.  Skin:    General: Skin is warm and dry.     Comments: Foot exam done  Neurological:     Mental Status: She is alert and oriented to person, place, and time.     Coordination: Coordination normal.    Vitals:   03/04/21 0806  BP: 120/78  Pulse: 73  Resp: 18  SpO2: 98%  Weight: 265 lb 9.6 oz (120.5 kg)  Height: 5\' 6"  (1.676 m)    This visit occurred during the SARS-CoV-2 public health emergency.  Safety protocols were in place, including screening questions prior to the visit, additional usage of staff PPE, and extensive cleaning of exam room while observing appropriate contact time as indicated for  disinfecting solutions.   Assessment & Plan:

## 2021-03-04 NOTE — Assessment & Plan Note (Signed)
Gets yearly mammogram up to date with gyn.

## 2021-03-04 NOTE — Assessment & Plan Note (Signed)
Foot exam done, up to date on eye exam. Checking labs today including microalbumin to creatinine ratio and HgA1c and lipid panel. Not on medication currently and diet has been poor lately. She would like to use diet modification if needed based on HgA1c as first line intervention and knows she can improve. Adjust as needed.

## 2021-03-04 NOTE — Assessment & Plan Note (Signed)
Flu shot up to date. Covid-19 counseled about booster. Tetanus up to date. Mammogram up to date with gyn, pap smear up to date with gyn. Counseled about sun safety and mole surveillance. Counseled about the dangers of distracted driving. Given 10 year screening recommendations.

## 2021-03-04 NOTE — Patient Instructions (Signed)
We will check the labs today. 

## 2021-03-04 NOTE — Assessment & Plan Note (Signed)
Currently using exercise and coping skills and in remission without medication.

## 2021-03-12 ENCOUNTER — Telehealth: Payer: 59 | Admitting: Physician Assistant

## 2021-03-12 DIAGNOSIS — J029 Acute pharyngitis, unspecified: Secondary | ICD-10-CM | POA: Diagnosis not present

## 2021-03-12 NOTE — Progress Notes (Signed)
E-Visit for Sore Throat   We are sorry that you are not feeling well.  Here is how we plan to help!   Your symptoms indicate a likely viral infection (Pharyngitis).   Pharyngitis is inflammation in the back of the throat which can cause a sore throat, scratchiness and sometimes difficulty swallowing.   Pharyngitis is typically caused by a respiratory virus and will just run its course.  Please keep in mind that your symptoms could last up to 10 days.  For throat pain, we recommend over the counter oral pain relief medications such as acetaminophen or aspirin, or anti-inflammatory medications such as ibuprofen or naproxen sodium.  Topical treatments such as oral throat lozenges or sprays may be used as needed.  Avoid close contact with loved ones, especially the very young and elderly.  Remember to wash your hands thoroughly throughout the day as this is the number one way to prevent the spread of infection and wipe down door knobs and counters with disinfectant.   After careful review of your answers, I would not recommend an antibiotic for your condition.  Antibiotics should not be used to treat conditions that we suspect are caused by viruses like the virus that causes the common cold or flu. However, some people can have Strep with atypical symptoms. You may need formal testing in clinic or office to confirm if your symptoms continue or worsen.   Providers prescribe antibiotics to treat infections caused by bacteria. Antibiotics are very powerful in treating bacterial infections when they are used properly.  To maintain their effectiveness, they should be used only when necessary.  Overuse of antibiotics has resulted in the development of super bugs that are resistant to treatment!     Home Care: Only take medications as instructed by your medical team. Do not drink alcohol while taking these medications. A steam or ultrasonic humidifier can help congestion.  You can place a towel over your head and  breathe in the steam from hot water coming from a faucet. Avoid close contacts especially the very young and the elderly. Cover your mouth when you cough or sneeze. Always remember to wash your hands.   Get Help Right Away If: You develop worsening fever or throat pain. You develop a severe head ache or visual changes. Your symptoms persist after you have completed your treatment plan.   Make sure you Understand these instructions. Will watch your condition. Will get help right away if you are not doing well or get worse.     Thank you for choosing an e-visit.   Your e-visit answers were reviewed by a board certified advanced clinical practitioner to complete your personal care plan. Depending upon the condition, your plan could have included both over the counter or prescription medications.   Please review your pharmacy choice. Make sure the pharmacy is open so you can pick up prescription now. If there is a problem, you may contact your provider through MyChart messaging and have the prescription routed to another pharmacy.  Your safety is important to us. If you have drug allergies check your prescription carefully.    For the next 24 hours you can use MyChart to ask questions about today's visit, request a non-urgent call back, or ask for a work or school excuse. You will get an email in the next two days asking about your experience. I hope that your e-visit has been valuable and will speed your recovery.  

## 2021-03-12 NOTE — Progress Notes (Signed)
I have spent 5 minutes in review of e-visit questionnaire, review and updating patient chart, medical decision making and response to patient.   Neelah Mannings Cody Azalya Galyon, PA-C    

## 2021-03-15 ENCOUNTER — Encounter (HOSPITAL_BASED_OUTPATIENT_CLINIC_OR_DEPARTMENT_OTHER): Payer: Self-pay | Admitting: *Deleted

## 2021-03-15 ENCOUNTER — Emergency Department (HOSPITAL_BASED_OUTPATIENT_CLINIC_OR_DEPARTMENT_OTHER)
Admission: EM | Admit: 2021-03-15 | Discharge: 2021-03-15 | Disposition: A | Discharge status: Home / Self Care | Payer: 59 | Attending: Emergency Medicine | Admitting: Emergency Medicine

## 2021-03-15 ENCOUNTER — Other Ambulatory Visit: Payer: Self-pay

## 2021-03-15 DIAGNOSIS — J029 Acute pharyngitis, unspecified: Secondary | ICD-10-CM | POA: Diagnosis present

## 2021-03-15 DIAGNOSIS — J45909 Unspecified asthma, uncomplicated: Secondary | ICD-10-CM | POA: Diagnosis not present

## 2021-03-15 DIAGNOSIS — E119 Type 2 diabetes mellitus without complications: Secondary | ICD-10-CM | POA: Diagnosis not present

## 2021-03-15 DIAGNOSIS — U071 COVID-19: Secondary | ICD-10-CM

## 2021-03-15 LAB — RESP PANEL BY RT-PCR (FLU A&B, COVID) ARPGX2
Influenza A by PCR: NEGATIVE
Influenza B by PCR: NEGATIVE
SARS Coronavirus 2 by RT PCR: POSITIVE — AB

## 2021-03-15 LAB — GROUP A STREP BY PCR: Group A Strep by PCR: NOT DETECTED

## 2021-03-15 MED ORDER — DEXAMETHASONE 4 MG PO TABS
10.0000 mg | ORAL_TABLET | Freq: Once | ORAL | Status: AC
  Administered 2021-03-15: 04:00:00 10 mg via ORAL
  Filled 2021-03-15: qty 3

## 2021-03-15 NOTE — Discharge Instructions (Signed)
Drink plenty fluids.  Take acetaminophen and/or ibuprofen as needed for pain.  Return if you develop difficulty breathing or are unable to swallow.

## 2021-03-15 NOTE — ED Provider Notes (Signed)
[facility] EMERGENCY DEPT Provider Note   CSN: [id number] Arrival date & time: 03/15/21  0210     History Chief Complaint  Patient presents with   Sore Throat    [name] is a 45 y.o. female.  The history is provided by the patient.  Sore Throat She has history of asthma, had diabetes and comes in because of sore throat for the last 3 days.  Symptoms have been getting worse to the point where it became very painful to swallow tonight.  She has had some slight, clear rhinorrhea.  She denies any cough.  She denies nausea, vomiting, diarrhea.  She denies arthralgias or myalgias.  She denies fever, chills, sweats.  She did try taking Mucinex because she was concerned about postnasal drainage, but it has not been effective.   Past Medical History:  Diagnosis Date   Anxiety    Asthma    Depression    Diabetes mellitus without complication (HCC)    Obesity    Pre-diabetes    PTSD (post-traumatic stress disorder)     Patient Active Problem List   Diagnosis Date Noted   Bilateral change in hearing 09/18/2020   Diabetes mellitus type 2 in obese (HCC) 01/02/2019   Routine general medical examination at a health care facility 12/30/2017   Family history of breast cancer 12/30/2017   Major depressive disorder, recurrent episode, moderate (HCC) 06/20/2012    Class: Chronic    Past Surgical History:  Procedure Laterality Date   BACK SURGERY  2004   SPINAL FUSION  2004   uterine ablation  2010     OB History   No obstetric history on file.     Family History  Problem Relation Age of Onset   Heart Problems Father    Breast cancer Maternal Aunt    Breast cancer Maternal Grandmother    Breast cancer Maternal Aunt     Social History   Tobacco Use   Smoking status: Never   Smokeless tobacco: Never  Vaping Use   Vaping Use: Never used  Substance Use Topics   Alcohol use: No    Comment: Occasion mixed drink once a month   Drug use: No     Home Medications Prior to Admission medications   Medication Sig Start Date End Date Taking? Authorizing Provider  PROAIR HFA 108 [phone] Base) MCG/ACT inhaler Take 90 mcg by mouth as needed. 03/26/16   [provider]    Allergies    Other and Bee venom  Review of Systems   Review of Systems  All other systems reviewed and are negative.  Physical Exam Updated Vital Signs BP 125/79 (BP Location: Right Arm)    Pulse 76    Temp 98.2 F (36.8 C) (Oral)    Resp 18    Ht 5\' 6"  (1.676 m)    Wt 120.2 kg    SpO2 100%    BMI 42.77 kg/m   Physical Exam Vitals and nursing note reviewed.  45 year old female, resting comfortably and in no acute distress. Vital signs are normal. Oxygen saturation is 100%, which is normal. Head is normocephalic and atraumatic. PERRLA, EOMI. Oropharynx is clear.  There is no pooling of secretions.  Phonation is normal. Neck is nontender and supple without adenopathy or JVD. Back is nontender and there is no CVA tenderness. Lungs are clear without rales, wheezes, or rhonchi. Chest is nontender. Heart has regular rate and rhythm without murmur. Abdomen is soft, flat, nontender.  Extremities have no cyanosis or edema, full range of motion is present. Skin is warm and dry without rash. Neurologic: Mental status is normal, cranial nerves are intact, moves all extremities equally.  ED Results / Procedures / Treatments   Labs (all labs ordered are listed, but only abnormal results are displayed) Labs Reviewed  RESP PANEL BY RT-PCR (FLU A&B, COVID) ARPGX2 - Abnormal; Notable for the following components:      Result Value   SARS Coronavirus 2 by RT PCR POSITIVE (*)    All other components within normal limits  GROUP A STREP BY PCR   Procedures Procedures   Medications Ordered in ED Medications  dexamethasone (DECADRON) tablet 10 mg (has no administration in time range)    ED Course  I have reviewed the triage vital signs and the nursing  notes.  Pertinent lab results that were available during my care of the patient were reviewed by me and considered in my medical decision making (see chart for details).   MDM Rules/Calculators/A&P                         Pharyngitis-strep versus viral.  Strep PCR is negative.  Respiratory pathogen panel is positive for COVID-19.  Apparently, her sore throat is secondary to the COVID infection.  She does not have any significant health risk factors for severe COVID, so is not a candidate for Paxlovid.  She is given a dose of dexamethasone and discharged with routine instructions for sore throats and COVID-19.  Return precautions discussed.  Old records are reviewed, and she has no relevant past visits.  Final Clinical Impression(s) / ED Diagnoses Final diagnoses:  COVID-19 virus infection  Sore throat    Rx / DC Orders ED Discharge Orders     None        Dione Booze, MD 03/15/21 [phone]

## 2021-03-15 NOTE — ED Triage Notes (Signed)
Pt c/o sore  throat x 2 days ago that now feels much worse

## 2021-03-31 DIAGNOSIS — F4310 Post-traumatic stress disorder, unspecified: Secondary | ICD-10-CM | POA: Diagnosis not present

## 2021-04-14 DIAGNOSIS — F4310 Post-traumatic stress disorder, unspecified: Secondary | ICD-10-CM | POA: Diagnosis not present

## 2021-04-25 DIAGNOSIS — F4310 Post-traumatic stress disorder, unspecified: Secondary | ICD-10-CM | POA: Diagnosis not present

## 2021-05-08 ENCOUNTER — Ambulatory Visit (INDEPENDENT_AMBULATORY_CARE_PROVIDER_SITE_OTHER): Payer: 59

## 2021-05-08 ENCOUNTER — Ambulatory Visit: Payer: 59 | Admitting: Podiatry

## 2021-05-08 ENCOUNTER — Other Ambulatory Visit: Payer: Self-pay

## 2021-05-08 ENCOUNTER — Encounter: Payer: Self-pay | Admitting: Podiatry

## 2021-05-08 ENCOUNTER — Other Ambulatory Visit (HOSPITAL_COMMUNITY): Payer: Self-pay

## 2021-05-08 DIAGNOSIS — M7751 Other enthesopathy of right foot: Secondary | ICD-10-CM | POA: Diagnosis not present

## 2021-05-08 DIAGNOSIS — M7750 Other enthesopathy of unspecified foot: Secondary | ICD-10-CM

## 2021-05-08 DIAGNOSIS — M722 Plantar fascial fibromatosis: Secondary | ICD-10-CM | POA: Diagnosis not present

## 2021-05-08 DIAGNOSIS — S82899A Other fracture of unspecified lower leg, initial encounter for closed fracture: Secondary | ICD-10-CM | POA: Insufficient documentation

## 2021-05-08 MED ORDER — MELOXICAM 15 MG PO TABS
15.0000 mg | ORAL_TABLET | Freq: Every day | ORAL | 3 refills | Status: DC
  Filled 2021-05-08: qty 30, 30d supply, fill #0

## 2021-05-08 NOTE — Patient Instructions (Signed)

## 2021-05-12 ENCOUNTER — Encounter: Payer: Self-pay | Admitting: Podiatry

## 2021-05-12 NOTE — Progress Notes (Signed)
°  Subjective:  Patient ID: [name], female    DOB: Dec 18, 1975,  MRN: [id number]  Chief Complaint  Patient presents with   Plantar Fasciitis    Right foot - had the same issues years ago and an injection helped a lot    46 y.o. female presents with the above complaint. History confirmed with patient.  Previously had this about 8 years ago and injection helps to limit it.  The pain started in late December  Objective:  Physical Exam: warm, good capillary refill, no trophic changes or ulcerative lesions, normal DP and PT pulses, and normal sensory exam. Left Foot: normal exam, no swelling, tenderness, instability; ligaments intact, full range of motion of all ankle/foot joints Right Foot: point tenderness over the heel pad  No images are attached to the encounter.  Radiographs: Multiple views x-ray of the right foot: no fracture, dislocation, swelling or degenerative changes noted and plantar calcaneal spur Assessment:   1. Plantar fasciitis, right      Plan:  Patient was evaluated and treated and all questions answered.  Discussed the etiology and treatment options for plantar fasciitis including stretching, formal physical therapy, supportive shoegears such as a running shoe or sneaker, pre fabricated orthoses, injection therapy, and oral medications. We also discussed the role of surgical treatment of this for patients who do not improve after exhausting non-surgical treatment options.   -XR reviewed with patient -Educated patient on stretching and icing of the affected limb -Plantar fascial brace dispensed -Injection delivered to the plantar fascia of the right foot. -Rx for meloxicam. Educated on use, risks and benefits of the medication  After sterile prep with povidone-iodine solution and alcohol, the right heel was injected with 0.5cc 2% xylocaine plain, 0.5cc 0.5% marcaine plain, 5mg  triamcinolone acetonide, and 2mg  dexamethasone was injected along the medial  plantar fascia at the insertion on the plantar calcaneus. The patient tolerated the procedure well without complication.  Return in about 1 month (around 06/05/2021) for recheck plantar fasciitis.

## 2021-05-13 DIAGNOSIS — F4310 Post-traumatic stress disorder, unspecified: Secondary | ICD-10-CM | POA: Diagnosis not present

## 2021-06-05 ENCOUNTER — Other Ambulatory Visit: Payer: Self-pay

## 2021-06-05 ENCOUNTER — Ambulatory Visit: Payer: 59 | Admitting: Podiatry

## 2021-06-05 DIAGNOSIS — M722 Plantar fascial fibromatosis: Secondary | ICD-10-CM

## 2021-06-09 DIAGNOSIS — F4310 Post-traumatic stress disorder, unspecified: Secondary | ICD-10-CM | POA: Diagnosis not present

## 2021-06-10 ENCOUNTER — Encounter: Payer: Self-pay | Admitting: Podiatry

## 2021-06-10 NOTE — Progress Notes (Signed)
?  Subjective:  ?Patient ID: [name], female    DOB: 1976/01/20,  MRN: [id number] ? ?Chief Complaint  ?Patient presents with  ? Plantar Fasciitis  ?  Patient states right heel pain has improved significantly since last office visit. Patient reports the discomfort is 75-80% better at this time. Reports still having pain in the center of the heel put not on the lateral or medial aspects of the heel like before.   ? ? ?46 y.o. female returns for follow-up with the above complaint. History confirmed with patient.  Doing better probably 75 to 80%, most of it is in the bottom of the heel at this point still ? ?Objective:  ?Physical Exam: ?warm, good capillary refill, no trophic changes or ulcerative lesions, normal DP and PT pulses, and normal sensory exam. ?Left Foot: normal exam, no swelling, tenderness, instability; ligaments intact, full range of motion of all ankle/foot joints ?Right Foot: point tenderness over the heel pad, much improved since last visit ? ?No images are attached to the encounter. ? ?Radiographs: ?Multiple views x-ray of the right foot: no fracture, dislocation, swelling or degenerative changes noted and plantar calcaneal spur ?Assessment:  ? ?1. Plantar fasciitis, right   ? ? ? ?Plan:  ?Patient was evaluated and treated and all questions answered. ? ?Discussed the etiology and treatment options for plantar fasciitis including stretching, formal physical therapy, supportive shoegears such as a running shoe or sneaker, pre fabricated orthoses, injection therapy, and oral medications. We also discussed the role of surgical treatment of this for patients who do not improve after exhausting non-surgical treatment options. ? ?Overall quite a bit improved.  She may continue to take the meloxicam as needed, recommend she continue her home therapy and stretching exercises and hopefully this will eliminate the rest of this.  We will hold off on further injection at this point unless she does not  improve or regresses.  Wear plantar fascia brace as needed.  She also is going to look into a [place] sock which she will get on [employer].  I will see her back in 6 weeks for final evaluation, hopefully nearly 100% by then ? ?Return in about 6 weeks (around 07/17/2021) for recheck plantar fasciitis.  ? ?

## 2021-07-07 DIAGNOSIS — F4310 Post-traumatic stress disorder, unspecified: Secondary | ICD-10-CM | POA: Diagnosis not present

## 2021-07-17 ENCOUNTER — Ambulatory Visit: Payer: 59 | Admitting: Podiatry

## 2021-07-17 DIAGNOSIS — M722 Plantar fascial fibromatosis: Secondary | ICD-10-CM

## 2021-07-17 NOTE — Patient Instructions (Signed)
Call to schedule physical therapy: [facility] [place] [address]  [phone]  

## 2021-07-20 ENCOUNTER — Encounter: Payer: Self-pay | Admitting: Podiatry

## 2021-07-20 NOTE — Progress Notes (Signed)
?  Subjective:  ?Patient ID: [name], female    DOB: 1975/06/19,  MRN: [id number] ? ?Chief Complaint  ?Patient presents with  ? Plantar Fasciitis  ?  Follow up right foot  ? ? ?46 y.o. female returns for follow-up with the above complaint. History confirmed with patient.  Has been doing much better last time I saw her but is becoming painful again.  The brace to continue to help as much. ? ?Objective:  ?Physical Exam: ?warm, good capillary refill, no trophic changes or ulcerative lesions, normal DP and PT pulses, and normal sensory exam. ?Left Foot: normal exam, no swelling, tenderness, instability; ligaments intact, full range of motion of all ankle/foot joints ?Right Foot: point tenderness over the heel pad, worsening since last visit now all medial ? ?No images are attached to the encounter. ? ?Radiographs: ?Multiple views x-ray of the right foot: no fracture, dislocation, swelling or degenerative changes noted and plantar calcaneal spur ?Assessment:  ? ?1. Plantar fasciitis, right   ? ? ? ?Plan:  ?Patient was evaluated and treated and all questions answered. ? ?Discussed the etiology and treatment options for plantar fasciitis including stretching, formal physical therapy, supportive shoegears such as a running shoe or sneaker, pre fabricated orthoses, injection therapy, and oral medications. We also discussed the role of surgical treatment of this for patients who do not improve after exhausting non-surgical treatment options. ? ?Recommended repeat injection today which was again performed now for the medial side, hopefully this eliminates the remainder since the lateral injection helped quite a bit with this pain.  She may use the plantar fascial brace as needed but is not helping as much.  We discussed new shoes as well.  I also sent a referral to physical therapy on [address] which she will begin.  I will see her back in 1 month for reevaluation.  If not improving will consider boot  immobilization and possible MRI ? ?After sterile prep with povidone-iodine solution and alcohol, the right heel was injected with 0.5cc 2% xylocaine plain, 0.5cc 0.5% marcaine plain, 5mg  triamcinolone acetonide, and 2mg  dexamethasone was injected along the medial plantar fascia at the insertion on the plantar calcaneus. The patient tolerated the procedure well without complication. ? ? ?Return in about 1 month (around 08/16/2021) for recheck plantar fasciitis.  ? ?

## 2021-07-21 DIAGNOSIS — F4310 Post-traumatic stress disorder, unspecified: Secondary | ICD-10-CM | POA: Diagnosis not present

## 2021-08-04 DIAGNOSIS — F4310 Post-traumatic stress disorder, unspecified: Secondary | ICD-10-CM | POA: Diagnosis not present

## 2021-08-21 ENCOUNTER — Ambulatory Visit: Payer: 59 | Admitting: Podiatry

## 2021-09-01 DIAGNOSIS — F4310 Post-traumatic stress disorder, unspecified: Secondary | ICD-10-CM | POA: Diagnosis not present

## 2021-09-15 DIAGNOSIS — F4310 Post-traumatic stress disorder, unspecified: Secondary | ICD-10-CM | POA: Diagnosis not present

## 2021-09-29 DIAGNOSIS — F4310 Post-traumatic stress disorder, unspecified: Secondary | ICD-10-CM | POA: Diagnosis not present

## 2021-10-01 DIAGNOSIS — H5213 Myopia, bilateral: Secondary | ICD-10-CM | POA: Diagnosis not present

## 2021-10-01 DIAGNOSIS — E119 Type 2 diabetes mellitus without complications: Secondary | ICD-10-CM | POA: Diagnosis not present

## 2021-10-01 DIAGNOSIS — H524 Presbyopia: Secondary | ICD-10-CM | POA: Diagnosis not present

## 2021-10-01 LAB — HM DIABETES EYE EXAM

## 2021-10-13 DIAGNOSIS — F4310 Post-traumatic stress disorder, unspecified: Secondary | ICD-10-CM | POA: Diagnosis not present

## 2021-10-27 DIAGNOSIS — F4310 Post-traumatic stress disorder, unspecified: Secondary | ICD-10-CM | POA: Diagnosis not present

## 2021-12-15 DIAGNOSIS — F4310 Post-traumatic stress disorder, unspecified: Secondary | ICD-10-CM | POA: Diagnosis not present

## 2022-01-29 DIAGNOSIS — F4310 Post-traumatic stress disorder, unspecified: Secondary | ICD-10-CM | POA: Diagnosis not present

## 2022-02-16 DIAGNOSIS — F4310 Post-traumatic stress disorder, unspecified: Secondary | ICD-10-CM | POA: Diagnosis not present

## 2022-03-02 DIAGNOSIS — F4310 Post-traumatic stress disorder, unspecified: Secondary | ICD-10-CM | POA: Diagnosis not present

## 2022-03-09 DIAGNOSIS — F4310 Post-traumatic stress disorder, unspecified: Secondary | ICD-10-CM | POA: Diagnosis not present

## 2022-03-10 ENCOUNTER — Encounter: Payer: 59 | Admitting: Internal Medicine

## 2022-03-26 ENCOUNTER — Ambulatory Visit: Payer: Commercial Managed Care - PPO | Admitting: Podiatry

## 2022-03-26 DIAGNOSIS — M722 Plantar fascial fibromatosis: Secondary | ICD-10-CM | POA: Diagnosis not present

## 2022-03-26 NOTE — Patient Instructions (Signed)
C1448 is the CPT code for orthotics, M72.2 is the ICD code for plantar fasciitis

## 2022-03-30 ENCOUNTER — Encounter: Payer: Self-pay | Admitting: Podiatry

## 2022-03-30 DIAGNOSIS — F4310 Post-traumatic stress disorder, unspecified: Secondary | ICD-10-CM | POA: Diagnosis not present

## 2022-03-30 NOTE — Progress Notes (Signed)
  Subjective:  Patient ID: [name], female    DOB: [birth year],  MRN: [id number]  Chief Complaint  Patient presents with   Plantar Fasciitis    right foot pain again, some days better, some days worse than others. Gets worse when standing for long periods or being up and walking. Pain can radiate up calf and into knee. Lifting leg to get into passenger side of SUV I now need to physically help lift leg due to pain and what feels like some weakness. She is not sure if another injection is what she needs or something else    47 y.o. female returns for follow-up with the above complaint. History confirmed with patient.  Was doing well until recently when it started to return.  Feels very tight in the calf  Objective:  Physical Exam: warm, good capillary refill, no trophic changes or ulcerative lesions, normal DP and PT pulses, and normal sensory exam. Left Foot: normal exam, no swelling, tenderness, instability; ligaments intact, full range of motion of all ankle/foot joints Right Foot: point tenderness over the heel pad, worsening since last visit now all medial, she has significant gastrocnemius equinus  No images are attached to the encounter.  Radiographs: Multiple views x-ray of the right foot: no fracture, dislocation, swelling or degenerative changes noted and plantar calcaneal spur Assessment:   1. Plantar fasciitis, right      Plan:  Patient was evaluated and treated and all questions answered.  I again recommended repeat injection today which has previously been helpful.  We also discussed that the equinus is a significant component and she will get a night splint on [employer] and use nightly.  We also discussed custom molded orthoses and she likely will benefit from these long-term we will consider fitting her for these if the pain is improving again.  After sterile prep with povidone-iodine solution and alcohol, the right heel was injected with 0.5cc 2% xylocaine plain,  0.5cc 0.5% marcaine plain, 5mg  triamcinolone acetonide, and 2mg  dexamethasone was injected along the medial plantar fascia at the insertion on the plantar calcaneus. The patient tolerated the procedure well without complication.   Return if symptoms worsen or fail to improve.

## 2022-04-13 DIAGNOSIS — F4310 Post-traumatic stress disorder, unspecified: Secondary | ICD-10-CM | POA: Diagnosis not present

## 2022-05-08 ENCOUNTER — Encounter: Payer: Self-pay | Admitting: Internal Medicine

## 2022-05-11 DIAGNOSIS — F4310 Post-traumatic stress disorder, unspecified: Secondary | ICD-10-CM | POA: Diagnosis not present

## 2022-05-25 DIAGNOSIS — F4310 Post-traumatic stress disorder, unspecified: Secondary | ICD-10-CM | POA: Diagnosis not present

## 2022-06-08 DIAGNOSIS — F4310 Post-traumatic stress disorder, unspecified: Secondary | ICD-10-CM | POA: Diagnosis not present

## 2022-06-11 ENCOUNTER — Telehealth: Payer: Commercial Managed Care - PPO | Admitting: Physician Assistant

## 2022-06-11 ENCOUNTER — Other Ambulatory Visit (HOSPITAL_COMMUNITY): Payer: Self-pay

## 2022-06-11 DIAGNOSIS — J029 Acute pharyngitis, unspecified: Secondary | ICD-10-CM

## 2022-06-11 MED ORDER — LIDOCAINE VISCOUS HCL 2 % MT SOLN
15.0000 mL | OROMUCOSAL | 0 refills | Status: DC | PRN
  Filled 2022-06-11 (×2): qty 200, 3d supply, fill #0

## 2022-06-11 NOTE — Progress Notes (Signed)
I have spent 5 minutes in review of e-visit questionnaire, review and updating patient chart, medical decision making and response to patient.   Christie Mclure Cody Zayin Valadez, PA-C    

## 2022-06-11 NOTE — Progress Notes (Signed)
  E-Visit for Sore Throat  We are sorry that you are not feeling well.  Here is how we plan to help!  Your symptoms indicate a likely viral infection (Pharyngitis).   Pharyngitis is inflammation in the back of the throat which can cause a sore throat, scratchiness and sometimes difficulty swallowing.   Pharyngitis is typically caused by a respiratory virus and will just run its course.  Please keep in mind that your symptoms could last up to 10 days.  For throat pain, we recommend over the counter oral pain relief medications such as acetaminophen or aspirin, or anti-inflammatory medications such as ibuprofen or naproxen sodium.  Topical treatments such as oral throat lozenges or sprays may be used as needed.  I have prescribed a mouth rinse to use to help with throat irritation. Avoid close contact with loved ones, especially the very young and elderly.  Remember to wash your hands thoroughly throughout the day as this is the number one way to prevent the spread of infection and wipe down door knobs and counters with disinfectant.  After careful review of your answers, I would not recommend an antibiotic for your condition.  Antibiotics should not be used to treat conditions that we suspect are caused by viruses like the virus that causes the common cold or flu. However, some people can have Strep with atypical symptoms. You may need formal testing in clinic or office to confirm if your symptoms continue or worsen.  Providers prescribe antibiotics to treat infections caused by bacteria. Antibiotics are very powerful in treating bacterial infections when they are used properly.  To maintain their effectiveness, they should be used only when necessary.  Overuse of antibiotics has resulted in the development of super bugs that are resistant to treatment!    Home Care: Only take medications as instructed by your medical team. Do not drink alcohol while taking these medications. A steam or ultrasonic  humidifier can help congestion.  You can place a towel over your head and breathe in the steam from hot water coming from a faucet. Avoid close contacts especially the very young and the elderly. Cover your mouth when you cough or sneeze. Always remember to wash your hands.  Get Help Right Away If: You develop worsening fever or throat pain. You develop a severe head ache or visual changes. Your symptoms persist after you have completed your treatment plan.  Make sure you Understand these instructions. Will watch your condition. Will get help right away if you are not doing well or get worse.   Thank you for choosing an e-visit.  Your e-visit answers were reviewed by a board certified advanced clinical practitioner to complete your personal care plan. Depending upon the condition, your plan could have included both over the counter or prescription medications.  Please review your pharmacy choice. Make sure the pharmacy is open so you can pick up prescription now. If there is a problem, you may contact your provider through [employer] and have the prescription routed to another pharmacy.  Your safety is important to [place]. If you have drug allergies check your prescription carefully.   For the next 24 hours you can use MyChart to ask questions about today's visit, request a non-urgent call back, or ask for a work or school excuse. You will get an email in the next two days asking about your experience. I hope that your e-visit has been valuable and will speed your recovery.

## 2022-06-22 DIAGNOSIS — F4310 Post-traumatic stress disorder, unspecified: Secondary | ICD-10-CM | POA: Diagnosis not present

## 2022-06-24 ENCOUNTER — Other Ambulatory Visit (HOSPITAL_COMMUNITY): Payer: Self-pay

## 2022-07-06 DIAGNOSIS — F4310 Post-traumatic stress disorder, unspecified: Secondary | ICD-10-CM | POA: Diagnosis not present

## 2022-07-11 ENCOUNTER — Telehealth: Payer: Commercial Managed Care - PPO | Admitting: Nurse Practitioner

## 2022-07-11 DIAGNOSIS — M545 Low back pain, unspecified: Secondary | ICD-10-CM

## 2022-07-11 MED ORDER — IBUPROFEN 600 MG PO TABS: 600.0000 mg | ORAL_TABLET | Freq: Three times a day (TID) | ORAL | 0 refills | Status: DC | PRN

## 2022-07-11 MED ORDER — METHOCARBAMOL 500 MG PO TABS: 500.0000 mg | ORAL_TABLET | Freq: Four times a day (QID) | ORAL | 0 refills | Status: DC

## 2022-07-11 NOTE — Progress Notes (Signed)
We are sorry that you are not feeling well.  Here is how we plan to help!  Based on what you have shared with me it looks like you mostly have acute back pain.  Acute back pain is defined as musculoskeletal pain that can resolve in 1-3 weeks with conservative treatment.  I have prescribed Motrin 600 mg take one by mouth every 8 hours prn non-steroid anti-inflammatory (NSAID) as well as Methocarbamol 500 mg every 6 hours as needed which is a muscle relaxer  Some patients experience stomach irritation or in increased heartburn with anti-inflammatory drugs.  Please keep in mind that muscle relaxer's can cause fatigue and should not be taken while at work or driving.  Back pain is very common.  The pain often gets better over time.  The cause of back pain is usually not dangerous.  Most people can learn to manage their back pain on their own.  Home Care Stay active.  Start with short walks on flat ground if you can.  Try to walk farther each day. Do not sit, drive or stand in one place for more than 30 minutes.  Do not stay in bed. Do not avoid exercise or work.  Activity can help your back heal faster. Be careful when you bend or lift an object.  Bend at your knees, keep the object close to you, and do not twist. Sleep on a firm mattress.  Lie on your side, and bend your knees.  If you lie on your back, put a pillow under your knees. Only take medicines as told by your doctor. Put ice on the injured area. Put ice in a plastic bag Place a towel between your skin and the bag Leave the ice on for 15-20 minutes, 3-4 times a day for the first 2-3 days. 210 After that, you can switch between ice and heat packs. Ask your doctor about back exercises or massage. Avoid feeling anxious or stressed.  Find good ways to deal with stress, such as exercise.  Get Help Right Way If: Your pain does not go away with rest or medicine. Your pain does not go away in 1 week. You have new problems. You do not feel  well. The pain spreads into your legs. You cannot control when you poop (bowel movement) or pee (urinate) You feel sick to your stomach (nauseous) or throw up (vomit) You have belly (abdominal) pain. You feel like you may pass out (faint). If you develop a fever.  Make Sure you: Understand these instructions. Will watch your condition Will get help right away if you are not doing well or get worse.  Your e-visit answers were reviewed by a board certified advanced clinical practitioner to complete your personal care plan.  Depending on the condition, your plan could have included both over the counter or prescription medications.  If there is a problem please reply  once you have received a response from your provider.  Your safety is important to [place].  If you have drug allergies check your prescription carefully.    You can use MyChart to ask questions about today's visit, request a non-urgent call back, or ask for a work or school excuse for 24 hours related to this e-Visit. If it has been greater than 24 hours you will need to follow up with your provider, or enter a new e-Visit to address those concerns.  You will get an e-mail in the next two days asking about your experience.  I hope  that your e-visit has been valuable and will speed your recovery. Thank you for using e-visits.

## 2022-07-11 NOTE — Progress Notes (Signed)
I have spent 5 minutes in review of e-visit questionnaire, review and updating patient chart, medical decision making and response to patient.  ° °Rozanne Heumann W Nicolasa Milbrath, NP ° °  °

## 2022-07-20 DIAGNOSIS — F4310 Post-traumatic stress disorder, unspecified: Secondary | ICD-10-CM | POA: Diagnosis not present

## 2022-07-22 ENCOUNTER — Encounter: Payer: Self-pay | Admitting: Internal Medicine

## 2022-07-22 ENCOUNTER — Ambulatory Visit (INDEPENDENT_AMBULATORY_CARE_PROVIDER_SITE_OTHER): Payer: Commercial Managed Care - PPO | Admitting: Internal Medicine

## 2022-07-22 VITALS — BP 100/80 | HR 80 | Temp 98.3°F

## 2022-07-22 DIAGNOSIS — E1169 Type 2 diabetes mellitus with other specified complication: Secondary | ICD-10-CM | POA: Diagnosis not present

## 2022-07-22 DIAGNOSIS — E785 Hyperlipidemia, unspecified: Secondary | ICD-10-CM | POA: Diagnosis not present

## 2022-07-22 DIAGNOSIS — E118 Type 2 diabetes mellitus with unspecified complications: Secondary | ICD-10-CM | POA: Diagnosis not present

## 2022-07-22 DIAGNOSIS — Z0000 Encounter for general adult medical examination without abnormal findings: Secondary | ICD-10-CM | POA: Diagnosis not present

## 2022-07-22 DIAGNOSIS — Z803 Family history of malignant neoplasm of breast: Secondary | ICD-10-CM

## 2022-07-22 DIAGNOSIS — F331 Major depressive disorder, recurrent, moderate: Secondary | ICD-10-CM

## 2022-07-22 LAB — COMPREHENSIVE METABOLIC PANEL
ALT: 19 U/L (ref 0–35)
AST: 13 U/L (ref 0–37)
Albumin: 3.7 g/dL (ref 3.5–5.2)
Alkaline Phosphatase: 48 U/L (ref 39–117)
BUN: 13 mg/dL (ref 6–23)
CO2: 25 mEq/L (ref 19–32)
Calcium: 9.1 mg/dL (ref 8.4–10.5)
Chloride: 101 mEq/L (ref 96–112)
Creatinine, Ser: 0.79 mg/dL (ref 0.40–1.20)
GFR: 90.76 mL/min (ref >60.00)
Glucose, Bld: 278 mg/dL — ABNORMAL HIGH (ref 70–99)
Potassium: 4.3 mEq/L (ref 3.5–5.1)
Sodium: 134 mEq/L — ABNORMAL LOW (ref 135–145)
Total Bilirubin: 0.5 mg/dL (ref 0.2–1.2)
Total Protein: 6.9 g/dL (ref 6.0–8.3)

## 2022-07-22 LAB — CBC
HCT: 41 % (ref 36.0–46.0)
Hemoglobin: 13.8 g/dL (ref 12.0–15.0)
MCHC: 33.8 g/dL (ref 30.0–36.0)
MCV: 88.7 fl (ref 78.0–100.0)
Platelets: 328 10*3/uL (ref 150.0–400.0)
RBC: 4.62 Mil/uL (ref 3.87–5.11)
RDW: 12.7 % (ref 11.5–15.5)
WBC: 8.7 10*3/uL (ref 4.0–10.5)

## 2022-07-22 LAB — MICROALBUMIN / CREATININE URINE RATIO
Creatinine,U: 105.4 mg/dL
Microalb Creat Ratio: 0.7 mg/g (ref 0.0–30.0)
Microalb, Ur: <0.7 mg/dL (ref 0.0–1.9)

## 2022-07-22 LAB — LIPID PANEL
Cholesterol: 182 mg/dL (ref 0–200)
HDL: 49.2 mg/dL (ref >39.00)
LDL Cholesterol: 111 mg/dL — ABNORMAL HIGH (ref 0–99)
NonHDL: 132.33
Total CHOL/HDL Ratio: 4
Triglycerides: 107 mg/dL (ref 0.0–149.0)
VLDL: 21.4 mg/dL (ref 0.0–40.0)

## 2022-07-22 LAB — HEMOGLOBIN A1C: Hgb A1c MFr Bld: 9.6 % — ABNORMAL HIGH (ref 4.6–6.5)

## 2022-07-22 NOTE — Progress Notes (Signed)
   Subjective:   Patient ID: [name], female    DOB: [birth year], 47 y.o.   MRN: [id number]  HPI The patient is here for physical.  PMH, [facility], social history reviewed and updated  Review of Systems  Constitutional: Negative.   HENT: Negative.    Eyes: Negative.   Respiratory:  Negative for cough, chest tightness and shortness of breath.   Cardiovascular:  Negative for chest pain, palpitations and leg swelling.  Gastrointestinal:  Negative for abdominal distention, abdominal pain, constipation, diarrhea, nausea and vomiting.  Musculoskeletal: Negative.   Skin: Negative.   Neurological: Negative.   Psychiatric/Behavioral:  Positive for dysphoric mood.     Objective:  Physical Exam Constitutional:      Appearance: She is well-developed.  HENT:     Head: Normocephalic and atraumatic.  Cardiovascular:     Rate and Rhythm: Normal rate and regular rhythm.  Pulmonary:     Effort: Pulmonary effort is normal. No respiratory distress.     Breath sounds: Normal breath sounds. No wheezing or rales.  Abdominal:     General: Bowel sounds are normal. There is no distension.     Palpations: Abdomen is soft.     Tenderness: There is no abdominal tenderness. There is no rebound.  Musculoskeletal:     Cervical back: Normal range of motion.  Skin:    General: Skin is warm and dry.  Neurological:     Mental Status: She is alert and oriented to person, place, and time.     Coordination: Coordination normal.     Vitals:   07/22/22 0804  BP: 100/80  Pulse: 80  Temp: 98.3 F (36.8 C)  TempSrc: Oral  SpO2: 99%    Assessment & Plan:

## 2022-07-22 NOTE — Assessment & Plan Note (Signed)
Due for mammogram counseled to make that apt and she will think about this.

## 2022-07-22 NOTE — Assessment & Plan Note (Signed)
Checking lipid panel and adjust as needed. Last LDL at 100. She does not want to start any medication currently.

## 2022-07-22 NOTE — Assessment & Plan Note (Signed)
With current symptoms and managing with counseling. Advised to let us know if this changes we can treat if needed.

## 2022-07-22 NOTE — Patient Instructions (Addendum)
We will check the labs and do not have to make any changes.

## 2022-07-22 NOTE — Assessment & Plan Note (Signed)
Flu shot yearly. Tetanus declines. Mammogram due, pap smear due. Counseled about sun safety and mole surveillance. Counseled about the dangers of distracted driving. Given 10 year screening recommendations.

## 2022-07-22 NOTE — Assessment & Plan Note (Signed)
Checking HgA1c, lipid panel, microalbumin to creatinine ratio. Checking CMP and CBC. Diet controlled currently. Does not want change today regardless of results due to stress and poor eating lately. Foot exam done eye exam up to date.

## 2022-08-03 ENCOUNTER — Other Ambulatory Visit: Payer: Self-pay | Admitting: Internal Medicine

## 2022-08-03 DIAGNOSIS — Z1231 Encounter for screening mammogram for malignant neoplasm of breast: Secondary | ICD-10-CM

## 2022-08-05 ENCOUNTER — Other Ambulatory Visit (HOSPITAL_COMMUNITY): Payer: Self-pay

## 2022-08-05 ENCOUNTER — Other Ambulatory Visit: Payer: Self-pay | Admitting: Nurse Practitioner

## 2022-08-05 DIAGNOSIS — M545 Low back pain, unspecified: Secondary | ICD-10-CM

## 2022-08-05 MED ORDER — METHOCARBAMOL 500 MG PO TABS
500.0000 mg | ORAL_TABLET | Freq: Four times a day (QID) | ORAL | 1 refills | Status: AC
  Filled 2022-08-05 (×2): qty 40, 10d supply, fill #0
  Filled 2022-08-15: qty 40, 10d supply, fill #1

## 2022-08-17 DIAGNOSIS — F4310 Post-traumatic stress disorder, unspecified: Secondary | ICD-10-CM | POA: Diagnosis not present

## 2022-08-18 ENCOUNTER — Ambulatory Visit
Admission: RE | Admit: 2022-08-18 | Discharge: 2022-08-18 | Disposition: A | Discharge status: Home / Self Care | Payer: Commercial Managed Care - PPO | Source: Ambulatory Visit

## 2022-08-18 DIAGNOSIS — Z1231 Encounter for screening mammogram for malignant neoplasm of breast: Secondary | ICD-10-CM | POA: Diagnosis not present

## 2022-08-24 ENCOUNTER — Encounter: Payer: Self-pay | Admitting: Internal Medicine

## 2022-08-24 NOTE — Telephone Encounter (Signed)
Can you please advise this for patient?

## 2022-08-27 ENCOUNTER — Ambulatory Visit (HOSPITAL_BASED_OUTPATIENT_CLINIC_OR_DEPARTMENT_OTHER): Payer: Managed Care, Other (non HMO)

## 2022-08-27 ENCOUNTER — Ambulatory Visit (HOSPITAL_BASED_OUTPATIENT_CLINIC_OR_DEPARTMENT_OTHER): Payer: Managed Care, Other (non HMO) | Admitting: Student

## 2022-08-27 ENCOUNTER — Encounter (HOSPITAL_BASED_OUTPATIENT_CLINIC_OR_DEPARTMENT_OTHER): Payer: Self-pay | Admitting: Student

## 2022-08-27 DIAGNOSIS — M79644 Pain in right finger(s): Secondary | ICD-10-CM

## 2022-08-27 NOTE — Progress Notes (Signed)
Chief Complaint: Right middle finger pain     History of Present Illness:    [name] is a 47 y.o. female presenting today for evaluation of pain in her right middle finger.  She states that this began about 2 days ago with no known injury or cause.  She states that it is generally painful to use and when straightening the finger.  Has noticed some mild tingling down into the palm.  Pain levels on average rated 3 out of 10.  Most of the discomfort is felt on the lateral side of the middle finger, worst at the PIP joint.  Has been taking ibuprofen and Tylenol for pain control.  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   Surgical History:   None  PMH/PSH/Family History/Social History/Meds/Allergies:    Past Medical History:  Diagnosis Date   Anxiety    Asthma    Depression    Diabetes mellitus without complication (HCC)    Obesity    Pre-diabetes    PTSD (post-traumatic stress disorder)    Past Surgical History:  Procedure Laterality Date   BACK SURGERY  2004   SPINAL FUSION  2004   uterine ablation  2010   Social History   Socioeconomic History   Marital status: Divorced    Spouse name: Not on file   Number of children: Not on file   Years of education: Not on file   Highest education level: Not on file  Occupational History   Not on file  Tobacco Use   Smoking status: Never   Smokeless tobacco: Never  Vaping Use   Vaping Use: Never used  Substance and Sexual Activity   Alcohol use: No    Comment: Occasion mixed drink once a month   Drug use: No   Sexual activity: Not Currently  Other Topics Concern   Not on file  Social History Narrative   Not on file   Social Determinants of Health   Financial Resource Strain: Not on file  Food Insecurity: Not on file  Transportation Needs: Not on file  Physical Activity: Not on file  Stress: Not on file  Social Connections: Not on file   Family History  Problem  Relation Age of Onset   Heart Problems Father    Breast cancer Maternal Aunt    Breast cancer Maternal Grandmother    Breast cancer Maternal Aunt    Allergies  Allergen Reactions   Other Other (See Comments)    All antibiotics-MUST HAVE ZOFRAN OR PHENERGAN BEFORE MED   Bee Venom Swelling and Other (See Comments)    "My arm begin to swell"    Current Outpatient Medications  Medication Sig Dispense Refill   methocarbamol (ROBAXIN) 500 MG tablet Take 1 tablet (500 mg total) by mouth 4 (four) times daily. 40 tablet 1   No current facility-administered medications for this visit.   No results found.  Review of Systems:   A ROS was performed including pertinent positives and negatives as documented in the HPI.  Physical Exam :   Constitutional: NAD and appears stated age Neurological: Alert and oriented Psych: Appropriate affect and cooperative There were no vitals taken for this visit.   Comprehensive Musculoskeletal Exam:    Tenderness to palpation along the lateral aspect of the right third  finger at the PIP, along the middle phalanx, and DIP joint.  Flexor and extensor mechanisms of the third finger intact.  Patient is able to make a full fist with very slight limitation in flexion of the third finger.  Radial pulse 2+.  Capillary refill less than 2 seconds.  Neurosensory exam intact.  Imaging:   Xray (right middle finger 3 views): Negative   I personally reviewed and interpreted the radiographs.   Assessment:   47 y.o. female with atraumatic right middle finger pain.  This is located mostly along the lateral aspect of the finger.  Based on exam I am suspicious of a mild collateral ligament sprain however there is no gapping or instability.  I would recommend proceeding with conservative treatment including continuing with anti-inflammatories as needed as well as buddy taping.  Discussed that I would expect to see her symptoms improve over the next few weeks, however should  symptoms persist after 3 to 4 weeks or significantly worsen, I can have her come back for reevaluation.  Plan :    -Return to clinic as needed     I personally saw and evaluated the patient, and participated in the management and treatment plan.  Hazle Nordmann, PA-C Orthopedics  This document was dictated using Conservation officer, historic buildings. A reasonable attempt at proof reading has been made to minimize errors.

## 2022-09-03 ENCOUNTER — Encounter: Payer: Self-pay | Admitting: Internal Medicine

## 2022-09-04 DIAGNOSIS — Z1231 Encounter for screening mammogram for malignant neoplasm of breast: Secondary | ICD-10-CM

## 2022-12-07 ENCOUNTER — Other Ambulatory Visit (HOSPITAL_COMMUNITY): Payer: Self-pay

## 2023-08-04 ENCOUNTER — Other Ambulatory Visit: Payer: Self-pay | Admitting: Family Medicine

## 2023-08-04 DIAGNOSIS — Z1231 Encounter for screening mammogram for malignant neoplasm of breast: Secondary | ICD-10-CM

## 2023-09-06 ENCOUNTER — Encounter

## 2023-09-06 DIAGNOSIS — Z1231 Encounter for screening mammogram for malignant neoplasm of breast: Secondary | ICD-10-CM

## 2023-09-23 ENCOUNTER — Ambulatory Visit
Admission: RE | Admit: 2023-09-23 | Discharge: 2023-09-23 | Disposition: A | Discharge status: Home / Self Care | Source: Ambulatory Visit | Attending: Family Medicine | Admitting: Family Medicine

## 2023-09-23 DIAGNOSIS — Z1231 Encounter for screening mammogram for malignant neoplasm of breast: Secondary | ICD-10-CM
